# Patient Record
Sex: Female | Born: 1950 | Race: White | Hispanic: No | Marital: Married | Smoking: Former smoker
Health system: Southern US, Community
[De-identification: ages and names within clinical notes are randomized; demographics above are authoritative.]

## PROBLEM LIST (undated history)

## (undated) DIAGNOSIS — M1990 Unspecified osteoarthritis, unspecified site: Secondary | ICD-10-CM

## (undated) DIAGNOSIS — T8859XA Other complications of anesthesia, initial encounter: Secondary | ICD-10-CM

## (undated) DIAGNOSIS — R112 Nausea with vomiting, unspecified: Secondary | ICD-10-CM

## (undated) DIAGNOSIS — K219 Gastro-esophageal reflux disease without esophagitis: Secondary | ICD-10-CM

## (undated) DIAGNOSIS — I10 Essential (primary) hypertension: Secondary | ICD-10-CM

## (undated) DIAGNOSIS — Z9889 Other specified postprocedural states: Secondary | ICD-10-CM

## (undated) DIAGNOSIS — E119 Type 2 diabetes mellitus without complications: Secondary | ICD-10-CM

## (undated) HISTORY — PX: COLONOSCOPY: SHX174

## (undated) HISTORY — PX: HYSTERECTOMY ABDOMINAL WITH SALPINGECTOMY: SHX6725

## (undated) HISTORY — PX: BACK SURGERY: SHX140

## (undated) HISTORY — PX: BLADDER SURGERY: SHX569

---

## 2020-05-16 NOTE — Patient Instructions (Addendum)
DUE TO COVID-19 ONLY ONE VISITOR IS ALLOWED TO COME WITH YOU AND STAY IN THE WAITING ROOM ONLY DURING PRE OP AND PROCEDURE DAY OF SURGERY. THE 1 VISITOR MAY VISIT WITH YOU AFTER SURGERY IN YOUR PRIVATE ROOM DURING VISITING HOURS ONLY!  YOU NEED TO HAVE A COVID 19 TEST ON 05-21-20 @ 10:55 AM, THIS TEST MUST BE DONE BEFORE SURGERY, COME  [address], [place] , [address].  [facility]) ONCE YOUR COVID TEST IS COMPLETED, PLEASE BEGIN THE QUARANTINE INSTRUCTIONS AS OUTLINED IN YOUR HANDOUT.                Robyn Johnston  05/16/2020   Your procedure is scheduled on: 05-25-20   Report to [facility] Main  Entrance    Report to Admitting at 12:40 PM     Call this number if you have problems the morning of surgery [phone]    Remember: FTER MIDNIGHT THE NIGHT PRIOR TO SURGERY. NOTHING BY MOUTH EXCEPT CLEAR LIQUIDS UNTIL 12:10 AM . PLEASE FINISH G2 DRINK PER SURGEON ORDER  WHICH NEEDS TO BE COMPLETED AT 12:10 AM .   CLEAR LIQUID DIET   Foods Allowed                                                                     Foods Excluded  Coffee and tea, regular and decaf                             liquids that you cannot  Plain Jell-O any favor except red or purple                                           see through such as: Fruit ices (not with fruit pulp)                                     milk, soups, orange juice  Iced Popsicles                                    All solid food Carbonated beverages, diet                                    Cranberry, grape and apple juices Sports drinks like Gatorade Lightly seasoned clear broth or consume(fat free) Sugar, honey syrup   _____________________________________________________________________       Take these medicines the morning of surgery with A SIP OF WATER: Atorvastatin, Esomeprazole (Nexium), Gabapentin (Neurontin)   BRUSH YOUR TEETH MORNING OF SURGERY AND RINSE YOUR MOUTH OUT, NO CHEWING GUM CANDY OR  MINTS.   DO NOT TAKE ANY DIABETIC MEDICATIONS DAY OF YOUR SURGERY  You may not have any metal on your body including hair pins and              piercings    Do not wear jewelry, make-up, lotions, powders or perfumes, deodorant              Do not wear nail polish on your fingernails.  Do not shave  48 hours prior to surgery.              Do not bring valuables to the hospital. [facility] IS NOT             RESPONSIBLE   FOR VALUABLES.  Contacts, dentures or bridgework may not be worn into surgery.  You may bring a small overnight bag     Special Instructions: N/A              Please read over the following fact sheets you were given: _____________________________________________________________________  How to Manage Your Diabetes Before and After Surgery  Why is it important to control my blood sugar before and after surgery? . Improving blood sugar levels before and after surgery helps healing and can limit problems. . A way of improving blood sugar control is eating a healthy diet by: o  Eating less sugar and carbohydrates o  Increasing activity/exercise o  Talking with your doctor about reaching your blood sugar goals . High blood sugars (greater than 180 mg/dL) can raise your risk of infections and slow your recovery, so you will need to focus on controlling your diabetes during the weeks before surgery. . Make sure that the doctor who takes care of your diabetes knows about your planned surgery including the date and location.  How do I manage my blood sugar before surgery? . Check your blood sugar at least 4 times a day, starting 2 days before surgery, to make sure that the level is not too high or low. o Check your blood sugar the morning of your surgery when you wake up and every 2 hours until you get to the Short Stay unit. . If your blood sugar is less than 70 mg/dL, you will need to treat for low blood sugar: o Do not take  insulin. o Treat a low blood sugar (less than 70 mg/dL) with  cup of clear juice (cranberry or apple), 4 glucose tablets, OR glucose gel. o Recheck blood sugar in 15 minutes after treatment (to make sure it is greater than 70 mg/dL). If your blood sugar is not greater than 70 mg/dL on recheck, call [phone] for further instructions. . Report your blood sugar to the short stay nurse when you get to Short Stay.  . If you are admitted to the hospital after surgery: o Your blood sugar will be checked by the staff and you will probably be given insulin after surgery (instead of oral diabetes medicines) to make sure you have good blood sugar levels. o The goal for blood sugar control after surgery is 80-180 mg/dL.   WHAT DO I DO ABOUT MY DIABETES MEDICATION?  Marland Kitchen Do not take oral diabetes medicines (pills) the morning of surgery.  . THE DAY BEFORE SURGERY, take your usual Metformin        Reviewed and Endorsed by [facility] Patient Education Committee, August 2015           [facility] - Preparing for Surgery Before surgery, you can play an important role.  Because skin is not sterile, your skin needs to be  as free of germs as possible.  You can reduce the number of germs on your skin by washing with CHG (chlorahexidine gluconate) soap before surgery.  CHG is an antiseptic cleaner which kills germs and bonds with the skin to continue killing germs even after washing. Please DO NOT use if you have an allergy to CHG or antibacterial soaps.  If your skin becomes reddened/irritated stop using the CHG and inform your nurse when you arrive at Short Stay. Do not shave (including legs and underarms) for at least 48 hours prior to the first CHG shower.  You may shave your face/neck. Please follow these instructions carefully:  1.  Shower with CHG Soap the night before surgery and the  morning of Surgery.  2.  If you choose to wash your hair, wash your hair first as usual with your  normal   shampoo.  3.  After you shampoo, rinse your hair and body thoroughly to remove the  shampoo.                           4.  Use CHG as you would any other liquid soap.  You can apply chg directly  to the skin and wash                       Gently with a scrungie or clean washcloth.  5.  Apply the CHG Soap to your body ONLY FROM THE NECK DOWN.   Do not use on face/ open                           Wound or open sores. Avoid contact with eyes, ears mouth and genitals (private parts).                       Wash face,  Genitals (private parts) with your normal soap.             6.  Wash thoroughly, paying special attention to the area where your surgery  will be performed.  7.  Thoroughly rinse your body with warm water from the neck down.  8.  DO NOT shower/wash with your normal soap after using and rinsing off  the CHG Soap.                9.  Pat yourself dry with a clean towel.            10.  Wear clean pajamas.            11.  Place clean sheets on your bed the night of your first shower and do not  sleep with pets. Day of Surgery : Do not apply any lotions/deodorants the morning of surgery.  Please wear clean clothes to the hospital/surgery center.  FAILURE TO FOLLOW THESE INSTRUCTIONS MAY RESULT IN THE CANCELLATION OF YOUR SURGERY PATIENT SIGNATURE_________________________________  NURSE SIGNATURE__________________________________  ________________________________________________________________________   Adam Phenix  An incentive spirometer is a tool that can help keep your lungs clear and active. This tool measures how well you are filling your lungs with each breath. Taking long deep breaths may help reverse or decrease the chance of developing breathing (pulmonary) problems (especially infection) following:  A long period of time when you are unable to move or be active. BEFORE THE PROCEDURE   If the spirometer includes an indicator to show your best effort,  your nurse or  respiratory therapist will set it to a desired goal.  If possible, sit up straight or lean slightly forward. Try not to slouch.  Hold the incentive spirometer in an upright position. INSTRUCTIONS FOR USE  1. Sit on the edge of your bed if possible, or sit up as far as you can in bed or on a chair. 2. Hold the incentive spirometer in an upright position. 3. Breathe out normally. 4. Place the mouthpiece in your mouth and seal your lips tightly around it. 5. Breathe in slowly and as deeply as possible, raising the piston or the ball toward the top of the column. 6. Hold your breath for 3-5 seconds or for as long as possible. Allow the piston or ball to fall to the bottom of the column. 7. Remove the mouthpiece from your mouth and breathe out normally. 8. Rest for a few seconds and repeat Steps 1 through 7 at least 10 times every 1-2 hours when you are awake. Take your time and take a few normal breaths between deep breaths. 9. The spirometer may include an indicator to show your best effort. Use the indicator as a goal to work toward during each repetition. 10. After each set of 10 deep breaths, practice coughing to be sure your lungs are clear. If you have an incision (the cut made at the time of surgery), support your incision when coughing by placing a pillow or rolled up towels firmly against it. Once you are able to get out of bed, walk around indoors and cough well. You may stop using the incentive spirometer when instructed by your caregiver.  RISKS AND COMPLICATIONS  Take your time so you do not get dizzy or light-headed.  If you are in pain, you may need to take or ask for pain medication before doing incentive spirometry. It is harder to take a deep breath if you are having pain. AFTER USE  Rest and breathe slowly and easily.  It can be helpful to keep track of a log of your progress. Your caregiver can provide you with a simple table to help with this. If you are using the  spirometer at home, follow these instructions: SEEK MEDICAL CARE IF:   You are having difficultly using the spirometer.  You have trouble using the spirometer as often as instructed.  Your pain medication is not giving enough relief while using the spirometer.  You develop fever of 100.5 F (38.1 C) or higher. SEEK IMMEDIATE MEDICAL CARE IF:   You cough up bloody sputum that had not been present before.  You develop fever of 102 F (38.9 C) or greater.  You develop worsening pain at or near the incision site. MAKE SURE YOU:   Understand these instructions.  Will watch your condition.  Will get help right away if you are not doing well or get worse. Document Released: 04/08/2007 Document Revised: 02/18/2012 Document Reviewed: 06/09/2007 ExitCare Patient Information 2014 ExitCare, [place].   ________________________________________________________________________  WHAT IS A BLOOD TRANSFUSION? Blood Transfusion Information  A transfusion is the replacement of blood or some of its parts. Blood is made up of multiple cells which provide different functions.  Red blood cells carry oxygen and are used for blood loss replacement.  White blood cells fight against infection.  Platelets control bleeding.  Plasma helps clot blood.  Other blood products are available for specialized needs, such as hemophilia or other clotting disorders. BEFORE THE TRANSFUSION  Who gives blood for transfusions?  Healthy volunteers who are fully evaluated to make sure their blood is safe. This is blood bank blood. Transfusion therapy is the safest it has ever been in the practice of medicine. Before blood is taken from a donor, a complete history is taken to make sure that person has no history of diseases nor engages in risky social behavior (examples are intravenous drug use or sexual activity with multiple partners). The donor's travel history is screened to minimize risk of transmitting  infections, such as malaria. The donated blood is tested for signs of infectious diseases, such as HIV and hepatitis. The blood is then tested to be sure it is compatible with you in order to minimize the chance of a transfusion reaction. If you or a relative donates blood, this is often done in anticipation of surgery and is not appropriate for emergency situations. It takes many days to process the donated blood. RISKS AND COMPLICATIONS Although transfusion therapy is very safe and saves many lives, the main dangers of transfusion include:   Getting an infectious disease.  Developing a transfusion reaction. This is an allergic reaction to something in the blood you were given. Every precaution is taken to prevent this. The decision to have a blood transfusion has been considered carefully by your caregiver before blood is given. Blood is not given unless the benefits outweigh the risks. AFTER THE TRANSFUSION  Right after receiving a blood transfusion, you will usually feel much better and more energetic. This is especially true if your red blood cells have gotten low (anemic). The transfusion raises the level of the red blood cells which carry oxygen, and this usually causes an energy increase.  The nurse administering the transfusion will monitor you carefully for complications. HOME CARE INSTRUCTIONS  No special instructions are needed after a transfusion. You may find your energy is better. Speak with your caregiver about any limitations on activity for underlying diseases you may have. SEEK MEDICAL CARE IF:   Your condition is not improving after your transfusion.  You develop redness or irritation at the intravenous (IV) site. SEEK IMMEDIATE MEDICAL CARE IF:  Any of the following symptoms occur over the next 12 hours:  Shaking chills.  You have a temperature by mouth above 102 F (38.9 C), not controlled by medicine.  Chest, back, or muscle pain.  People around you feel you are  not acting correctly or are confused.  Shortness of breath or difficulty breathing.  Dizziness and fainting.  You get a rash or develop hives.  You have a decrease in urine output.  Your urine turns a dark color or changes to pink, red, or brown. Any of the following symptoms occur over the next 10 days:  You have a temperature by mouth above 102 F (38.9 C), not controlled by medicine.  Shortness of breath.  Weakness after normal activity.  The white part of the eye turns yellow (jaundice).  You have a decrease in the amount of urine or are urinating less often.  Your urine turns a dark color or changes to pink, red, or brown. Document Released: 11/23/2000 Document Revised: 02/18/2012 Document Reviewed: 07/12/2008 [facility] Patient Information 2014 [place], [place].  _______________________________________________________________________

## 2020-05-16 NOTE — Progress Notes (Addendum)
PCP - Dr. Kara Pacer,  [facility]  Cardiologist - N/A No back stimulator  PPM/ICD -  Device Orders -  Rep Notified -   Chest x-ray -  EKG -  Stress Test -  ECHO -  Cardiac Cath -   Sleep Study -  CPAP -   Fasting Blood Sugar -  Checks Blood Sugar _____ times a day  Blood Thinner Instructions: Aspirin Instructions:  ERAS Protcol - PRE-SURGERY Ensure or G2-   COVID TEST-  COVID Vaccinations x 2  Anesthesia review:   Patient denies shortness of breath, fever, cough and chest pain at PAT appointment   All instructions explained to the patient, with a verbal understanding of the material. Patient agrees to go over the instructions while at home for a better understanding. Patient also instructed to self quarantine after being tested for COVID-19. The opportunity to ask questions was provided.

## 2020-05-17 ENCOUNTER — Other Ambulatory Visit: Payer: Self-pay

## 2020-05-17 ENCOUNTER — Encounter (HOSPITAL_COMMUNITY)
Admission: RE | Admit: 2020-05-17 | Discharge: 2020-05-17 | Disposition: A | Discharge status: Home / Self Care | Payer: Medicare HMO | Source: Ambulatory Visit | Attending: Orthopedic Surgery | Admitting: Orthopedic Surgery

## 2020-05-17 ENCOUNTER — Encounter (HOSPITAL_COMMUNITY): Payer: Self-pay

## 2020-05-17 HISTORY — DX: Nausea with vomiting, unspecified: R11.2

## 2020-05-17 HISTORY — DX: Type 2 diabetes mellitus without complications: E11.9

## 2020-05-17 HISTORY — DX: Gastro-esophageal reflux disease without esophagitis: K21.9

## 2020-05-17 HISTORY — DX: Essential (primary) hypertension: I10

## 2020-05-17 HISTORY — DX: Other specified postprocedural states: Z98.890

## 2020-05-17 HISTORY — DX: Other complications of anesthesia, initial encounter: T88.59XA

## 2020-05-17 HISTORY — DX: Unspecified osteoarthritis, unspecified site: M19.90

## 2020-05-20 ENCOUNTER — Encounter (HOSPITAL_COMMUNITY)
Admission: RE | Admit: 2020-05-20 | Discharge: 2020-05-20 | Disposition: A | Discharge status: Home / Self Care | Payer: Medicare HMO | Source: Ambulatory Visit | Attending: Orthopedic Surgery | Admitting: Orthopedic Surgery

## 2020-05-20 ENCOUNTER — Other Ambulatory Visit: Payer: Self-pay

## 2020-05-20 DIAGNOSIS — Z01818 Encounter for other preprocedural examination: Secondary | ICD-10-CM | POA: Diagnosis present

## 2020-05-20 LAB — COMPREHENSIVE METABOLIC PANEL
ALT: 12 U/L (ref 0–44)
AST: 16 U/L (ref 15–41)
Albumin: 4 g/dL (ref 3.5–5.0)
Alkaline Phosphatase: 53 U/L (ref 38–126)
Anion gap: 8 (ref 5–15)
BUN: 13 mg/dL (ref 8–23)
CO2: 31 mmol/L (ref 22–32)
Calcium: 9.6 mg/dL (ref 8.9–10.3)
Chloride: 102 mmol/L (ref 98–111)
Creatinine, Ser: 0.86 mg/dL (ref 0.44–1.00)
GFR calc Af Amer: >60 mL/min (ref >60)
GFR calc non Af Amer: >60 mL/min (ref >60)
Glucose, Bld: 116 mg/dL — ABNORMAL HIGH (ref 70–99)
Potassium: 4.4 mmol/L (ref 3.5–5.1)
Sodium: 141 mmol/L (ref 135–145)
Total Bilirubin: 0.8 mg/dL (ref 0.3–1.2)
Total Protein: 6.8 g/dL (ref 6.5–8.1)

## 2020-05-20 LAB — CBC
HCT: 35.6 % — ABNORMAL LOW (ref 36.0–46.0)
Hemoglobin: 11.8 g/dL — ABNORMAL LOW (ref 12.0–15.0)
MCH: 33 pg (ref 26.0–34.0)
MCHC: 33.1 g/dL (ref 30.0–36.0)
MCV: 99.4 fL (ref 80.0–100.0)
Platelets: 215 10*3/uL (ref 150–400)
RBC: 3.58 MIL/uL — ABNORMAL LOW (ref 3.87–5.11)
RDW: 12.4 % (ref 11.5–15.5)
WBC: 5.7 10*3/uL (ref 4.0–10.5)
nRBC: 0 % (ref 0.0–0.2)

## 2020-05-20 LAB — HEMOGLOBIN A1C
Hgb A1c MFr Bld: 6.1 % — ABNORMAL HIGH (ref 4.8–5.6)
Mean Plasma Glucose: 128.37 mg/dL

## 2020-05-20 LAB — PROTIME-INR
INR: 1 (ref 0.8–1.2)
Prothrombin Time: 12.7 seconds (ref 11.4–15.2)

## 2020-05-20 LAB — ABO/RH: ABO/RH(D): O POS

## 2020-05-20 LAB — GLUCOSE, CAPILLARY: Glucose-Capillary: 96 mg/dL (ref 70–99)

## 2020-05-20 LAB — APTT: aPTT: 35 seconds (ref 24–36)

## 2020-05-21 ENCOUNTER — Other Ambulatory Visit (HOSPITAL_COMMUNITY)
Admission: RE | Admit: 2020-05-21 | Discharge: 2020-05-21 | Disposition: A | Discharge status: Home / Self Care | Payer: Medicare HMO | Source: Ambulatory Visit | Attending: Orthopedic Surgery | Admitting: Orthopedic Surgery

## 2020-05-21 DIAGNOSIS — Z20822 Contact with and (suspected) exposure to covid-19: Secondary | ICD-10-CM | POA: Diagnosis not present

## 2020-05-21 DIAGNOSIS — Z01812 Encounter for preprocedural laboratory examination: Secondary | ICD-10-CM | POA: Diagnosis present

## 2020-05-21 LAB — SARS CORONAVIRUS 2 (TAT 6-24 HRS): SARS Coronavirus 2: NEGATIVE

## 2020-05-23 NOTE — H&P (Signed)
TOTAL HIP ADMISSION H&P  Patient is admitted for left total hip arthroplasty.  Subjective:  Chief Complaint: Left hip pain  HPI: Robyn Johnston, 69 y.o. female, has a history of pain and functional disability in the left hip due to arthritis and patient has failed non-surgical conservative treatments for greater than 12 weeks to include corticosteriod injections and activity modification. Onset of symptoms was gradual, starting 3 years ago with gradually worsening course since that time. The patient noted no past surgery on the left hip. Patient currently rates pain in the left hip at 7 out of 10 with activity. Patient has worsening of pain with activity and weight bearing, pain that interfers with activities of daily living and instability. Patient has evidence of severe end-stage arthritis of both hips, bone-on-bone throughout, with subchondral cystic formation in both by imaging studies. This condition presents safety issues increasing the risk of falls. There is no current active infection.  There are no problems to display for this patient.   Past Medical History:  Diagnosis Date  . Arthritis   . Complication of anesthesia   . Diabetes mellitus without complication (HCC)   . GERD (gastroesophageal reflux disease)   . Hypertension   . PONV (postoperative nausea and vomiting)     Past Surgical History:  Procedure Laterality Date  . BACK SURGERY     spinal stenosis  . BLADDER SURGERY    . COLONOSCOPY    . HYSTERECTOMY ABDOMINAL WITH SALPINGECTOMY      Prior to Admission medications   Medication Sig Start Date End Date Taking? Authorizing Provider  atorvastatin (LIPITOR) 10 MG tablet Take 10 mg by mouth daily. 05/02/20  Yes [provider]  Biotin 2500 MCG CAPS Take 2,500 mcg by mouth daily.   Yes [provider]  Cholecalciferol (VITAMIN D) 125 MCG (5000 UT) CAPS Take 5,000 Units by mouth daily.   Yes [provider]  Cyanocobalamin (B-12) 2500 MCG TABS  Take 2,500 mcg by mouth daily.   Yes [provider]  esomeprazole (NEXIUM) 40 MG capsule Take 40 mg by mouth daily. 04/18/20  Yes [provider]  gabapentin (NEURONTIN) 600 MG tablet Take 600 mg by mouth in the morning, at noon, in the evening, and at bedtime. 04/08/20  Yes [provider]  lisinopril-hydrochlorothiazide (ZESTORETIC) 20-25 MG tablet Take 1 tablet by mouth daily. 05/10/20  Yes [provider]  metFORMIN (GLUCOPHAGE-XR) 500 MG 24 hr tablet Take 500 mg by mouth daily. 04/18/20  Yes [provider]  Multiple Vitamin (MULTIVITAMIN WITH MINERALS) TABS tablet Take 1 tablet by mouth daily.   Yes [provider]    Allergies  Allergen Reactions  . Penicillins Hives    Social History   Socioeconomic History  . Marital status: Married    Spouse name: Not on file  . Number of children: Not on file  . Years of education: Not on file  . Highest education level: Not on file  Occupational History  . Not on file  Tobacco Use  . Smoking status: Former Smoker    Quit date: 2011    Years since quitting: 10.4  . Smokeless tobacco: Never Used  Vaping Use  . Vaping Use: Never used  Substance and Sexual Activity  . Alcohol use: Never  . Drug use: Never  . Sexual activity: Not on file  Other Topics Concern  . Not on file  Social History Narrative  . Not on file   Social Determinants of Health  Financial Resource Strain:   . Difficulty of Paying Living Expenses:   Food Insecurity:   . Worried About Charity fundraiser in the Last Year:   . Arboriculturist in the Last Year:   Transportation Needs:   . Film/video editor (Medical):   Marland Kitchen Lack of Transportation (Non-Medical):   Physical Activity:   . Days of Exercise per Week:   . Minutes of Exercise per Session:   Stress:   . Feeling of Stress :   Social Connections:   . Frequency of Communication with Friends and Family:   . Frequency of Social Gatherings with Friends  and Family:   . Attends Religious Services:   . Active Member of Clubs or Organizations:   . Attends Archivist Meetings:   Marland Kitchen Marital Status:   Intimate Partner Violence:   . Fear of Current or Ex-Partner:   . Emotionally Abused:   Marland Kitchen Physically Abused:   . Sexually Abused:       Tobacco Use: Medium Risk  . Smoking Tobacco Use: Former Smoker  . Smokeless Tobacco Use: Never Used   Social History   Substance and Sexual Activity  Alcohol Use Never    No family history on file.  Review of Systems  Constitutional: Negative for chills and fever.  HENT: Negative for congestion, sore throat and tinnitus.   Eyes: Negative for double vision, photophobia and pain.  Respiratory: Negative for cough, shortness of breath and wheezing.   Cardiovascular: Negative for chest pain, palpitations and orthopnea.  Gastrointestinal: Negative for heartburn, nausea and vomiting.  Genitourinary: Negative for dysuria, frequency and urgency.  Musculoskeletal: Positive for joint pain.  Neurological: Negative for dizziness, weakness and headaches.     Objective:  Physical Exam:  Well nourished and well developed.  General: Alert and oriented x3, cooperative and pleasant, no acute distress.  Head: normocephalic, atraumatic, neck supple.  Eyes: EOMI.  Respiratory: breath sounds clear in all fields, no wheezing, rales, or rhonchi. Cardiovascular: Regular rate and rhythm, no murmurs, gallops or rubs.  Abdomen: non-tender to palpation and soft, normoactive bowel sounds. Musculoskeletal:  Left Hip Exam: The range of motion: Flexion to 90 degrees, Internal Rotation to 0 degrees, External Rotation to 0 degrees, and abduction to 0 degrees.  Calves soft and nontender. Motor function intact in LE. Strength 5/5 LE bilaterally. Neuro: Distal pulses 2+. Sensation to light touch intact in LE.  Imaging Review Plain radiographs demonstrate severe degenerative joint disease of the left hip. The bone  quality appears to be adequate for age and reported activity level.  Assessment/Plan:  End stage arthritis, left hip  The patient history, physical examination, clinical judgement of the provider and imaging studies are consistent with end stage degenerative joint disease of the left hip and total hip arthroplasty is deemed medically necessary. The treatment options including medical management, injection therapy, arthroscopy and arthroplasty were discussed at length. The risks and benefits of total hip arthroplasty were presented and reviewed. The risks due to aseptic loosening, infection, stiffness, dislocation/subluxation, thromboembolic complications and other imponderables were discussed. The patient acknowledged the explanation, agreed to proceed with the plan and consent was signed. Patient is being admitted for inpatient treatment for surgery, pain control, PT, OT, prophylactic antibiotics, VTE prophylaxis, progressive ambulation and ADLs and discharge planning.The patient is planning to be discharged home.   Patient's anticipated LOS is less than 2 midnights, meeting these requirements: - Younger than 34 - Lives within 1 hour of care -  Has a competent adult at home to recover with post-op recover - NO history of  - Chronic pain requiring opiods  - Diabetes  - Coronary Artery Disease  - Heart failure  - Heart attack  - Stroke  - DVT/VTE  - Cardiac arrhythmia  - Respiratory Failure/COPD  - Renal failure  - Anemia  - Advanced Liver disease  Therapy Plans: HEP Disposition: Home with husband Planned DVT Prophylaxis: Xarelto 10 mg QD DME Needed: None PCP: Sharlotte Alamo, MD  TXA: IV Allergies: PCN (hives) Anesthesia Concerns: Nausea/vomiting BMI: 32.8 Last HgbA1c: 6.1%  Other: - Cannot tolerate aspirin  - Patient was instructed on what medications to stop prior to surgery. - Follow-up visit in 2 weeks with [name] - Begin physical therapy following  surgery - Pre-operative lab work as pre-surgical testing - Prescriptions will be provided in hospital at time of discharge  Arther Abbott, PA-C Orthopedic Surgery [facility] [place]

## 2020-05-24 ENCOUNTER — Encounter (HOSPITAL_COMMUNITY): Payer: Self-pay | Admitting: Orthopedic Surgery

## 2020-05-24 LAB — SURGICAL PCR SCREEN
MRSA, PCR: NEGATIVE
Staphylococcus aureus: NEGATIVE

## 2020-05-25 ENCOUNTER — Ambulatory Visit (HOSPITAL_COMMUNITY)
Admission: RE | Admit: 2020-05-25 | Discharge: 2020-05-26 | Disposition: A | Discharge status: Home / Self Care | Payer: Medicare HMO | Attending: Orthopedic Surgery | Admitting: Orthopedic Surgery

## 2020-05-25 ENCOUNTER — Encounter (HOSPITAL_COMMUNITY): Payer: Self-pay | Admitting: Orthopedic Surgery

## 2020-05-25 ENCOUNTER — Ambulatory Visit (HOSPITAL_COMMUNITY): Payer: Medicare HMO

## 2020-05-25 ENCOUNTER — Ambulatory Visit (HOSPITAL_COMMUNITY): Payer: Medicare HMO | Admitting: Physician Assistant

## 2020-05-25 ENCOUNTER — Encounter (HOSPITAL_COMMUNITY)
Admission: RE | Disposition: A | Discharge status: Home / Self Care | Payer: Self-pay | Source: Home / Self Care | Attending: Orthopedic Surgery

## 2020-05-25 ENCOUNTER — Other Ambulatory Visit: Payer: Self-pay

## 2020-05-25 ENCOUNTER — Ambulatory Visit (HOSPITAL_COMMUNITY): Payer: Medicare HMO | Admitting: Anesthesiology

## 2020-05-25 DIAGNOSIS — K219 Gastro-esophageal reflux disease without esophagitis: Secondary | ICD-10-CM | POA: Insufficient documentation

## 2020-05-25 DIAGNOSIS — M25852 Other specified joint disorders, left hip: Secondary | ICD-10-CM | POA: Diagnosis not present

## 2020-05-25 DIAGNOSIS — M1612 Unilateral primary osteoarthritis, left hip: Secondary | ICD-10-CM | POA: Diagnosis present

## 2020-05-25 DIAGNOSIS — Z880 Allergy status to penicillin: Secondary | ICD-10-CM | POA: Insufficient documentation

## 2020-05-25 DIAGNOSIS — M25752 Osteophyte, left hip: Secondary | ICD-10-CM | POA: Insufficient documentation

## 2020-05-25 DIAGNOSIS — Z79899 Other long term (current) drug therapy: Secondary | ICD-10-CM | POA: Diagnosis not present

## 2020-05-25 DIAGNOSIS — I10 Essential (primary) hypertension: Secondary | ICD-10-CM | POA: Insufficient documentation

## 2020-05-25 DIAGNOSIS — Z87891 Personal history of nicotine dependence: Secondary | ICD-10-CM | POA: Insufficient documentation

## 2020-05-25 DIAGNOSIS — Z7984 Long term (current) use of oral hypoglycemic drugs: Secondary | ICD-10-CM | POA: Diagnosis not present

## 2020-05-25 DIAGNOSIS — E119 Type 2 diabetes mellitus without complications: Secondary | ICD-10-CM | POA: Diagnosis not present

## 2020-05-25 DIAGNOSIS — Z96649 Presence of unspecified artificial hip joint: Secondary | ICD-10-CM

## 2020-05-25 DIAGNOSIS — M169 Osteoarthritis of hip, unspecified: Secondary | ICD-10-CM | POA: Diagnosis present

## 2020-05-25 DIAGNOSIS — Z419 Encounter for procedure for purposes other than remedying health state, unspecified: Secondary | ICD-10-CM

## 2020-05-25 HISTORY — PX: TOTAL HIP ARTHROPLASTY: SHX124

## 2020-05-25 LAB — TYPE AND SCREEN
ABO/RH(D): O POS
Antibody Screen: NEGATIVE

## 2020-05-25 LAB — GLUCOSE, CAPILLARY
Glucose-Capillary: 86 mg/dL (ref 70–99)
Glucose-Capillary: 91 mg/dL (ref 70–99)
Glucose-Capillary: 154 mg/dL — ABNORMAL HIGH (ref 70–99)

## 2020-05-25 SURGERY — ARTHROPLASTY, HIP, TOTAL, ANTERIOR APPROACH
Anesthesia: Spinal | Site: Hip | Laterality: Left

## 2020-05-25 MED ORDER — HYDROCHLOROTHIAZIDE 25 MG PO TABS
25.0000 mg | ORAL_TABLET | Freq: Every day | ORAL | Status: DC
  Administered 2020-05-26: 25 mg via ORAL
  Filled 2020-05-25: qty 1

## 2020-05-25 MED ORDER — CEFAZOLIN SODIUM-DEXTROSE 2-4 GM/100ML-% IV SOLN
2.0000 g | Freq: Four times a day (QID) | INTRAVENOUS | Status: AC
  Administered 2020-05-25 – 2020-05-26 (×2): 2 g via INTRAVENOUS
  Filled 2020-05-25 (×2): qty 100

## 2020-05-25 MED ORDER — GABAPENTIN 300 MG PO CAPS
600.0000 mg | ORAL_CAPSULE | Freq: Three times a day (TID) | ORAL | Status: DC
  Administered 2020-05-25 – 2020-05-26 (×3): 600 mg via ORAL
  Filled 2020-05-25 (×3): qty 2

## 2020-05-25 MED ORDER — FENTANYL CITRATE (PF) 100 MCG/2ML IJ SOLN
25.0000 ug | INTRAMUSCULAR | Status: DC | PRN
  Administered 2020-05-25: 50 ug via INTRAVENOUS

## 2020-05-25 MED ORDER — PHENOL 1.4 % MT LIQD: 1.0000 | OROMUCOSAL | Status: DC | PRN

## 2020-05-25 MED ORDER — METOCLOPRAMIDE HCL 5 MG PO TABS: 5.0000 mg | ORAL_TABLET | Freq: Three times a day (TID) | ORAL | Status: DC | PRN

## 2020-05-25 MED ORDER — PHENYLEPHRINE HCL-NACL 10-0.9 MG/250ML-% IV SOLN
INTRAVENOUS | Status: DC | PRN
  Administered 2020-05-25: 25 ug/min via INTRAVENOUS

## 2020-05-25 MED ORDER — ONDANSETRON HCL 4 MG/2ML IJ SOLN: 4.0000 mg | Freq: Four times a day (QID) | INTRAMUSCULAR | Status: DC | PRN

## 2020-05-25 MED ORDER — FENTANYL CITRATE (PF) 100 MCG/2ML IJ SOLN
INTRAMUSCULAR | Status: DC | PRN
  Administered 2020-05-25: 25 ug via INTRAVENOUS
  Administered 2020-05-25: 50 ug via INTRAVENOUS
  Administered 2020-05-25: 25 ug via INTRAVENOUS

## 2020-05-25 MED ORDER — FENTANYL CITRATE (PF) 100 MCG/2ML IJ SOLN
INTRAMUSCULAR | Status: AC
  Filled 2020-05-25: qty 2

## 2020-05-25 MED ORDER — ORAL CARE MOUTH RINSE: 15.0000 mL | Freq: Once | OROMUCOSAL | Status: AC

## 2020-05-25 MED ORDER — METHOCARBAMOL 500 MG IVPB - SIMPLE MED
500.0000 mg | Freq: Four times a day (QID) | INTRAVENOUS | Status: DC | PRN
  Filled 2020-05-25: qty 50

## 2020-05-25 MED ORDER — METOCLOPRAMIDE HCL 5 MG/ML IJ SOLN: 5.0000 mg | Freq: Three times a day (TID) | INTRAMUSCULAR | Status: DC | PRN

## 2020-05-25 MED ORDER — 0.9 % SODIUM CHLORIDE (POUR BTL) OPTIME
TOPICAL | Status: DC | PRN
  Administered 2020-05-25: 1000 mL

## 2020-05-25 MED ORDER — ONDANSETRON HCL 4 MG PO TABS: 4.0000 mg | ORAL_TABLET | Freq: Four times a day (QID) | ORAL | Status: DC | PRN

## 2020-05-25 MED ORDER — SODIUM CHLORIDE 0.9 % IV SOLN
INTRAVENOUS | Status: DC
  Administered 2020-05-25: 1000 mL via INTRAVENOUS

## 2020-05-25 MED ORDER — BISACODYL 10 MG RE SUPP: 10.0000 mg | Freq: Every day | RECTAL | Status: DC | PRN

## 2020-05-25 MED ORDER — TRANEXAMIC ACID-NACL 1000-0.7 MG/100ML-% IV SOLN
1000.0000 mg | INTRAVENOUS | Status: AC
  Administered 2020-05-25: 1000 mg via INTRAVENOUS
  Filled 2020-05-25: qty 100

## 2020-05-25 MED ORDER — PANTOPRAZOLE SODIUM 40 MG PO TBEC
80.0000 mg | DELAYED_RELEASE_TABLET | Freq: Every day | ORAL | Status: DC
  Administered 2020-05-26: 80 mg via ORAL
  Filled 2020-05-25: qty 2

## 2020-05-25 MED ORDER — MIDAZOLAM HCL 2 MG/2ML IJ SOLN
INTRAMUSCULAR | Status: AC
  Filled 2020-05-25: qty 2

## 2020-05-25 MED ORDER — POVIDONE-IODINE 10 % EX SWAB
2.0000 "application " | Freq: Once | CUTANEOUS | Status: AC
  Administered 2020-05-25: 2 via TOPICAL

## 2020-05-25 MED ORDER — CEFAZOLIN SODIUM-DEXTROSE 2-4 GM/100ML-% IV SOLN
2.0000 g | INTRAVENOUS | Status: AC
  Administered 2020-05-25: 2 g via INTRAVENOUS
  Filled 2020-05-25: qty 100

## 2020-05-25 MED ORDER — MORPHINE SULFATE (PF) 2 MG/ML IV SOLN
0.5000 mg | INTRAVENOUS | Status: DC | PRN
  Administered 2020-05-25 – 2020-05-26 (×5): 1 mg via INTRAVENOUS
  Filled 2020-05-25 (×5): qty 1

## 2020-05-25 MED ORDER — ONDANSETRON HCL 4 MG/2ML IJ SOLN: 4.0000 mg | Freq: Once | INTRAMUSCULAR | Status: DC | PRN

## 2020-05-25 MED ORDER — ATORVASTATIN CALCIUM 10 MG PO TABS
10.0000 mg | ORAL_TABLET | Freq: Every day | ORAL | Status: DC
  Administered 2020-05-26: 10 mg via ORAL
  Filled 2020-05-25: qty 1

## 2020-05-25 MED ORDER — TRAMADOL HCL 50 MG PO TABS
50.0000 mg | ORAL_TABLET | Freq: Four times a day (QID) | ORAL | Status: DC | PRN
  Administered 2020-05-26: 100 mg via ORAL
  Filled 2020-05-25: qty 2

## 2020-05-25 MED ORDER — ACETAMINOPHEN 325 MG PO TABS: 325.0000 mg | ORAL_TABLET | Freq: Four times a day (QID) | ORAL | Status: DC | PRN

## 2020-05-25 MED ORDER — DOCUSATE SODIUM 100 MG PO CAPS
100.0000 mg | ORAL_CAPSULE | Freq: Two times a day (BID) | ORAL | Status: DC
  Administered 2020-05-25 – 2020-05-26 (×2): 100 mg via ORAL
  Filled 2020-05-25 (×2): qty 1

## 2020-05-25 MED ORDER — CHLORHEXIDINE GLUCONATE 0.12 % MT SOLN
15.0000 mL | Freq: Once | OROMUCOSAL | Status: AC
  Administered 2020-05-25: 15 mL via OROMUCOSAL

## 2020-05-25 MED ORDER — HYDROCODONE-ACETAMINOPHEN 5-325 MG PO TABS
1.0000 | ORAL_TABLET | ORAL | Status: DC | PRN
  Administered 2020-05-25 – 2020-05-26 (×3): 2 via ORAL
  Filled 2020-05-25 (×3): qty 2

## 2020-05-25 MED ORDER — BUPIVACAINE IN DEXTROSE 0.75-8.25 % IT SOLN
INTRATHECAL | Status: DC | PRN
  Administered 2020-05-25: 1.6 mL via INTRATHECAL

## 2020-05-25 MED ORDER — MENTHOL 3 MG MT LOZG: 1.0000 | LOZENGE | OROMUCOSAL | Status: DC | PRN

## 2020-05-25 MED ORDER — MIDAZOLAM HCL 5 MG/5ML IJ SOLN
INTRAMUSCULAR | Status: DC | PRN
  Administered 2020-05-25: 2 mg via INTRAVENOUS

## 2020-05-25 MED ORDER — METHOCARBAMOL 500 MG PO TABS
500.0000 mg | ORAL_TABLET | Freq: Four times a day (QID) | ORAL | Status: DC | PRN
  Administered 2020-05-25 – 2020-05-26 (×4): 500 mg via ORAL
  Filled 2020-05-25 (×4): qty 1

## 2020-05-25 MED ORDER — ACETAMINOPHEN 10 MG/ML IV SOLN: 1000.0000 mg | Freq: Once | INTRAVENOUS | Status: DC | PRN

## 2020-05-25 MED ORDER — DEXAMETHASONE SODIUM PHOSPHATE 10 MG/ML IJ SOLN
8.0000 mg | Freq: Once | INTRAMUSCULAR | Status: AC
  Administered 2020-05-25: 8 mg via INTRAVENOUS

## 2020-05-25 MED ORDER — BUPIVACAINE HCL 0.25 % IJ SOLN
INTRAMUSCULAR | Status: DC | PRN
  Administered 2020-05-25: 30 mL

## 2020-05-25 MED ORDER — METHOCARBAMOL 500 MG IVPB - SIMPLE MED
INTRAVENOUS | Status: AC
  Administered 2020-05-25: 500 mg via INTRAVENOUS
  Filled 2020-05-25: qty 50

## 2020-05-25 MED ORDER — INSULIN ASPART 100 UNIT/ML ~~LOC~~ SOLN: 0.0000 [IU] | Freq: Every day | SUBCUTANEOUS | Status: DC

## 2020-05-25 MED ORDER — DEXAMETHASONE SODIUM PHOSPHATE 10 MG/ML IJ SOLN
10.0000 mg | Freq: Once | INTRAMUSCULAR | Status: AC
  Administered 2020-05-26: 10 mg via INTRAVENOUS
  Filled 2020-05-25: qty 1

## 2020-05-25 MED ORDER — RIVAROXABAN 10 MG PO TABS
10.0000 mg | ORAL_TABLET | Freq: Every day | ORAL | Status: DC
  Administered 2020-05-26: 10 mg via ORAL
  Filled 2020-05-25: qty 1

## 2020-05-25 MED ORDER — MAGNESIUM CITRATE PO SOLN: 1.0000 | Freq: Once | ORAL | Status: DC | PRN

## 2020-05-25 MED ORDER — ONDANSETRON HCL 4 MG/2ML IJ SOLN
INTRAMUSCULAR | Status: DC | PRN
  Administered 2020-05-25: 4 mg via INTRAVENOUS

## 2020-05-25 MED ORDER — ACETAMINOPHEN 10 MG/ML IV SOLN
1000.0000 mg | Freq: Four times a day (QID) | INTRAVENOUS | Status: DC
  Administered 2020-05-25: 1000 mg via INTRAVENOUS
  Filled 2020-05-25: qty 100

## 2020-05-25 MED ORDER — LIP MEDEX EX OINT
TOPICAL_OINTMENT | CUTANEOUS | Status: DC | PRN
  Filled 2020-05-25: qty 7

## 2020-05-25 MED ORDER — PROPOFOL 500 MG/50ML IV EMUL
INTRAVENOUS | Status: DC | PRN
  Administered 2020-05-25: 75 ug/kg/min via INTRAVENOUS

## 2020-05-25 MED ORDER — FENTANYL CITRATE (PF) 100 MCG/2ML IJ SOLN
INTRAMUSCULAR | Status: AC
  Administered 2020-05-25: 50 ug via INTRAVENOUS
  Filled 2020-05-25: qty 2

## 2020-05-25 MED ORDER — INSULIN ASPART 100 UNIT/ML ~~LOC~~ SOLN: 0.0000 [IU] | Freq: Three times a day (TID) | SUBCUTANEOUS | Status: DC

## 2020-05-25 MED ORDER — LIDOCAINE 2% (20 MG/ML) 5 ML SYRINGE
INTRAMUSCULAR | Status: DC | PRN
  Administered 2020-05-25: 60 mg via INTRAVENOUS

## 2020-05-25 MED ORDER — POLYETHYLENE GLYCOL 3350 17 G PO PACK: 17.0000 g | PACK | Freq: Every day | ORAL | Status: DC | PRN

## 2020-05-25 MED ORDER — BUPIVACAINE HCL 0.25 % IJ SOLN
INTRAMUSCULAR | Status: AC
  Filled 2020-05-25: qty 1

## 2020-05-25 MED ORDER — LACTATED RINGERS IV SOLN: INTRAVENOUS | Status: DC

## 2020-05-25 MED ORDER — WATER FOR IRRIGATION, STERILE IR SOLN
Status: DC | PRN
  Administered 2020-05-25: 2000 mL

## 2020-05-25 SURGICAL SUPPLY — 47 items
BAG DECANTER FOR FLEXI CONT (MISCELLANEOUS) IMPLANT
BAG ZIPLOCK 12X15 (MISCELLANEOUS) IMPLANT
BLADE SAG 18X100X1.27 (BLADE) ×3 IMPLANT
CLOSURE WOUND 1/2 X4 (GAUZE/BANDAGES/DRESSINGS) ×2
COVER PERINEAL POST (MISCELLANEOUS) ×3 IMPLANT
COVER SURGICAL LIGHT HANDLE (MISCELLANEOUS) ×3 IMPLANT
COVER WAND RF STERILE (DRAPES) IMPLANT
CUP ACET PINNACLE SECTR 50MM (Hips) ×1 IMPLANT
DECANTER SPIKE VIAL GLASS SM (MISCELLANEOUS) ×3 IMPLANT
DRAPE STERI IOBAN 125X83 (DRAPES) ×3 IMPLANT
DRAPE U-SHAPE 47X51 STRL (DRAPES) ×6 IMPLANT
DRSG ADAPTIC 3X8 NADH LF (GAUZE/BANDAGES/DRESSINGS) ×3 IMPLANT
DRSG AQUACEL AG ADV 3.5X10 (GAUZE/BANDAGES/DRESSINGS) ×3 IMPLANT
DURAPREP 26ML APPLICATOR (WOUND CARE) ×3 IMPLANT
ELECT REM PT RETURN 15FT ADLT (MISCELLANEOUS) ×3 IMPLANT
EVACUATOR 1/8 PVC DRAIN (DRAIN) IMPLANT
FEM STEM 12/14 TAPER SZ 4 HIP (Orthopedic Implant) ×3 IMPLANT
FEMORAL STEM 12/14 TPR SZ4 HIP (Orthopedic Implant) ×1 IMPLANT
GLOVE BIO SURGEON STRL SZ 6 (GLOVE) IMPLANT
GLOVE BIO SURGEON STRL SZ7 (GLOVE) IMPLANT
GLOVE BIO SURGEON STRL SZ8 (GLOVE) ×3 IMPLANT
GLOVE BIOGEL PI IND STRL 6.5 (GLOVE) IMPLANT
GLOVE BIOGEL PI IND STRL 7.0 (GLOVE) IMPLANT
GLOVE BIOGEL PI IND STRL 8 (GLOVE) ×1 IMPLANT
GLOVE BIOGEL PI INDICATOR 6.5 (GLOVE)
GLOVE BIOGEL PI INDICATOR 7.0 (GLOVE)
GLOVE BIOGEL PI INDICATOR 8 (GLOVE) ×2
GOWN STRL REUS W/TWL LRG LVL3 (GOWN DISPOSABLE) ×3 IMPLANT
GOWN STRL REUS W/TWL XL LVL3 (GOWN DISPOSABLE) IMPLANT
HEAD FEMORAL 32 CERAMIC (Hips) ×3 IMPLANT
HOLDER FOLEY CATH W/STRAP (MISCELLANEOUS) ×3 IMPLANT
KIT TURNOVER KIT A (KITS) IMPLANT
LINER MARATHON 32 50 (Hips) ×3 IMPLANT
MANIFOLD NEPTUNE II (INSTRUMENTS) ×3 IMPLANT
PACK ANTERIOR HIP CUSTOM (KITS) ×3 IMPLANT
PENCIL SMOKE EVACUATOR COATED (MISCELLANEOUS) ×3 IMPLANT
PINNACLE SECTOR CUP 50MM (Hips) ×3 IMPLANT
STRIP CLOSURE SKIN 1/2X4 (GAUZE/BANDAGES/DRESSINGS) ×4 IMPLANT
SUT ETHIBOND NAB CT1 #1 30IN (SUTURE) ×3 IMPLANT
SUT MNCRL AB 4-0 PS2 18 (SUTURE) ×3 IMPLANT
SUT STRATAFIX 0 PDS 27 VIOLET (SUTURE) ×3
SUT VIC AB 2-0 CT1 27 (SUTURE) ×4
SUT VIC AB 2-0 CT1 TAPERPNT 27 (SUTURE) ×2 IMPLANT
SUTURE STRATFX 0 PDS 27 VIOLET (SUTURE) ×1 IMPLANT
SYR 50ML LL SCALE MARK (SYRINGE) IMPLANT
TRAY FOLEY MTR SLVR 16FR STAT (SET/KITS/TRAYS/PACK) ×3 IMPLANT
YANKAUER SUCT BULB TIP 10FT TU (MISCELLANEOUS) ×3 IMPLANT

## 2020-05-25 NOTE — Discharge Instructions (Addendum)
Gaynelle Arabian, MD Total Joint Specialist [facility] [address] [place], [place] [address] [phone]  ANTERIOR APPROACH TOTAL HIP REPLACEMENT POSTOPERATIVE DIRECTIONS     Hip Rehabilitation, Guidelines Following Surgery  The results of a hip operation are greatly improved after range of motion and muscle strengthening exercises. Follow all safety measures which are given to protect your hip. If any of these exercises cause increased pain or swelling in your joint, decrease the amount until you are comfortable again. Then slowly increase the exercises. Call your caregiver if you have problems or questions.   BLOOD CLOT PREVENTION . Take a 10 mg Xarelto once a day for three weeks following surgery. Then take an 81 mg Aspirin once a day for three weeks. Then discontinue Aspirin. . You may resume your vitamins/supplements once you have discontinued the Xarelto. . Do not take any NSAIDs (Advil, Aleve, Ibuprofen, Meloxicam, etc.) until you have discontinued the Xarelto.   HOME CARE INSTRUCTIONS  . Remove items at home which could result in a fall. This includes throw rugs or furniture in walking pathways.   ICE to the affected hip as frequently as 20-30 minutes an hour and then as needed for pain and swelling. Continue to use ice on the hip for pain and swelling from surgery. You may notice swelling that will progress down to the foot and ankle. This is normal after surgery. Elevate the leg when you are not up walking on it.    Continue to use the breathing machine which will help keep your temperature down.  It is common for your temperature to cycle up and down following surgery, especially at night when you are not up moving around and exerting yourself.  The breathing machine keeps your lungs expanded and your temperature down.  DIET You may resume your previous home diet once your are discharged from the hospital.  DRESSING / WOUND CARE /  SHOWERING . You have an adhesive waterproof bandage over the incision. Leave this in place until your first follow-up appointment. Once you remove this you will not need to place another bandage.  . You may begin showering 3 days following surgery, but do not submerge the incision under water.  ACTIVITY . For the first 3-5 days, it is important to rest and keep the operative leg elevated. You should, as a general rule, rest for 50 minutes and walk/stretch for 10 minutes per hour. After 5 days, you may slowly increase activity as tolerated.  Marland Kitchen Perform the exercises you were provided twice a day for about 15-20 minutes each session. Begin these 2 days following surgery. . Walk with your walker as instructed. Use the walker until you are comfortable transitioning to a cane. Walk with the cane in the opposite hand of the operative leg. You may discontinue the cane once you are comfortable and walking steadily. . Avoid periods of inactivity such as sitting longer than an hour when not asleep. This helps prevent blood clots.  . Do not drive a car for 6 weeks or until released by your surgeon.  . Do not drive while taking narcotics.  TED HOSE STOCKINGS Wear the elastic stockings on both legs for three weeks following surgery during the day. You may remove them at night while sleeping.  WEIGHT BEARING Weight bearing as tolerated with assist device (walker, cane, etc) as directed, use it as long as suggested by your surgeon or therapist, typically at least 4-6 weeks.  POSTOPERATIVE CONSTIPATION PROTOCOL Constipation -  defined medically as fewer than three stools per week and severe constipation as less than one stool per week.  One of the most common issues patients have following surgery is constipation.  Even if you have a regular bowel pattern at home, your normal regimen is likely to be disrupted due to multiple reasons following surgery.  Combination of anesthesia, postoperative narcotics, change in  appetite and fluid intake all can affect your bowels.  In order to avoid complications following surgery, here are some recommendations in order to help you during your recovery period.  . Colace (docusate) - Pick up an over-the-counter form of Colace or another stool softener and take twice a day as long as you are requiring postoperative pain medications.  Take with a full glass of water daily.  If you experience loose stools or diarrhea, hold the colace until you stool forms back up.  If your symptoms do not get better within 1 week or if they get worse, check with your doctor. . Dulcolax (bisacodyl) - Pick up over-the-counter and take as directed by the product packaging as needed to assist with the movement of your bowels.  Take with a full glass of water.  Use this product as needed if not relieved by Colace only.  . MiraLax (polyethylene glycol) - Pick up over-the-counter to have on hand.  MiraLax is a solution that will increase the amount of water in your bowels to assist with bowel movements.  Take as directed and can mix with a glass of water, juice, soda, coffee, or tea.  Take if you go more than two days without a movement.Do not use MiraLax more than once per day. Call your doctor if you are still constipated or irregular after using this medication for 7 days in a row.  If you continue to have problems with postoperative constipation, please contact the office for further assistance and recommendations.  If you experience "the worst abdominal pain ever" or develop nausea or vomiting, please contact the office immediatly for further recommendations for treatment.  ITCHING  If you experience itching with your medications, try taking only a single pain pill, or even half a pain pill at a time.  You can also use Benadryl over the counter for itching or also to help with sleep.   MEDICATIONS See your medication summary on the "After Visit Summary" that the nursing staff will review with you  prior to discharge.  You may have some home medications which will be placed on hold until you complete the course of blood thinner medication.  It is important for you to complete the blood thinner medication as prescribed by your surgeon.  Continue your approved medications as instructed at time of discharge.  PRECAUTIONS If you experience chest pain or shortness of breath - call 911 immediately for transfer to the hospital emergency department.  If you develop a fever greater that 101 F, purulent drainage from wound, increased redness or drainage from wound, foul odor from the wound/dressing, or calf pain - CONTACT YOUR SURGEON.                                                   FOLLOW-UP APPOINTMENTS Make sure you keep all of your appointments after your operation with your surgeon and caregivers. You should call the office at the above phone number and   make an appointment for approximately two weeks after the date of your surgery or on the date instructed by your surgeon outlined in the "After Visit Summary".  RANGE OF MOTION AND STRENGTHENING EXERCISES  These exercises are designed to help you keep full movement of your hip joint. Follow your caregiver's or physical therapist's instructions. Perform all exercises about fifteen times, three times per day or as directed. Exercise both hips, even if you have had only one joint replacement. These exercises can be done on a training (exercise) mat, on the floor, on a table or on a bed. Use whatever works the best and is most comfortable for you. Use music or television while you are exercising so that the exercises are a pleasant break in your day. This will make your life better with the exercises acting as a break in routine you can look forward to.  . Lying on your back, slowly slide your foot toward your buttocks, raising your knee up off the floor. Then slowly slide your foot back down until your leg is straight again.  . Lying on your back spread  your legs as far apart as you can without causing discomfort.  . Lying on your side, raise your upper leg and foot straight up from the floor as far as is comfortable. Slowly lower the leg and repeat.  . Lying on your back, tighten up the muscle in the front of your thigh (quadriceps muscles). You can do this by keeping your leg straight and trying to raise your heel off the floor. This helps strengthen the largest muscle supporting your knee.  . Lying on your back, tighten up the muscles of your buttocks both with the legs straight and with the knee bent at a comfortable angle while keeping your heel on the floor.   IF YOU ARE TRANSFERRED TO A SKILLED REHAB FACILITY If the patient is transferred to a skilled rehab facility following release from the hospital, a list of the current medications will be sent to the facility for the patient to continue.  When discharged from the skilled rehab facility, please have the facility set up the patient's Home Health Physical Therapy prior to being released. Also, the skilled facility will be responsible for providing the patient with their medications at time of release from the facility to include their pain medication, the muscle relaxants, and their blood thinner medication. If the patient is still at the rehab facility at time of the two week follow up appointment, the skilled rehab facility will also need to assist the patient in arranging follow up appointment in our office and any transportation needs.  MAKE SURE YOU:  . Understand these instructions.  . Get help right away if you are not doing well or get worse.    DENTAL ANTIBIOTICS:  In most cases prophylactic antibiotics for Dental procdeures after total joint surgery are not necessary.  Exceptions are as follows:  1. History of prior total joint infection  2. Severely immunocompromised (Organ Transplant, cancer chemotherapy, Rheumatoid biologic meds such as Humera)  3. Poorly controlled  diabetes (A1C &gt; 8.0, blood glucose over 200)  If you have one of these conditions, contact your surgeon for an antibiotic prescription, prior to your dental procedure.    Pick up stool softner and laxative for home use following surgery while on pain medications. Do not submerge incision under water. Please use good hand washing techniques while changing dressing each day. May shower starting three days after surgery. Please   use a clean towel to pat the incision dry following showers. Continue to use ice for pain and swelling after surgery. Do not use any lotions or creams on the incision until instructed by your surgeon.  Information on my medicine - XARELTO (Rivaroxaban)  This medication education was reviewed with me or my healthcare representative as part of my discharge preparation.  The pharmacist that spoke with me during my hospital stay was:    Why was Xarelto prescribed for you? Xarelto was prescribed for you to reduce the risk of blood clots forming after orthopedic surgery. The medical term for these abnormal blood clots is venous thromboembolism (VTE).  What do you need to know about xarelto ? Take your Xarelto ONCE DAILY at the same time every day. You may take it either with or without food.  If you have difficulty swallowing the tablet whole, you may crush it and mix in applesauce just prior to taking your dose.  Take Xarelto exactly as prescribed by your doctor and DO NOT stop taking Xarelto without talking to the doctor who prescribed the medication.  Stopping without other VTE prevention medication to take the place of Xarelto may increase your risk of developing a clot.  After discharge, you should have regular check-up appointments with your healthcare provider that is prescribing your Xarelto.    What do you do if you miss a dose? If you miss a dose, take it as soon as you remember on the same day then continue your regularly scheduled once daily  regimen the next day. Do not take two doses of Xarelto on the same day.   Important Safety Information A possible side effect of Xarelto is bleeding. You should call your healthcare provider right away if you experience any of the following: ? Bleeding from an injury or your nose that does not stop. ? Unusual colored urine (red or dark brown) or unusual colored stools (red or black). ? Unusual bruising for unknown reasons. ? A serious fall or if you hit your head (even if there is no bleeding).  Some medicines may interact with Xarelto and might increase your risk of bleeding while on Xarelto. To help avoid this, consult your healthcare provider or pharmacist prior to using any new prescription or non-prescription medications, including herbals, vitamins, non-steroidal anti-inflammatory drugs (NSAIDs) and supplements.  This website has more information on Xarelto: VisitDestination.com.br.

## 2020-05-25 NOTE — Evaluation (Signed)
Physical Therapy Evaluation Patient Details Name: Robyn Johnston MRN: [id number] DOB: 04/05/1951 Today's Date: 05/25/2020   History of Present Illness  s/p L DATHA with history of back surgery.  Clinical Impression  Pt is s/p L DA THA resulting in the deficits listed below (see PT Problem List). Pt will benefit from acute PT to increase their independence and safety with mobility to allow discharge safely home with husband assisting. Pt with fair amount of pain and some diaphoretic with movement ( sweating, hot), therefore limited distance and only transferred to recliner tonight.      Follow Up Recommendations Follow surgeons recommendation for DC plan and follow-up therapies    Equipment Recommendations  None recommended by PT    Recommendations for Other Services       Precautions / Restrictions Precautions Precautions: None Restrictions Weight Bearing Restrictions: No      Mobility  Bed Mobility Overal bed mobility: Needs Assistance Bed Mobility: Supine to Sit;Sit to Supine     Supine to sit: Min assist        Transfers Overall transfer level: Needs assistance Equipment used: Rolling walker (2 wheeled) Transfers: Sit to/from Stand Sit to Stand: Min assist            Ambulation/Gait Ambulation/Gait assistance: Min assist Gait Distance (Feet): 5 Feet (small steps in room to pivot) Assistive device: Rolling walker (2 wheeled) Gait Pattern/deviations: Step-to pattern        Stairs            Wheelchair Mobility    Modified Rankin (Stroke Patients Only)       Balance Overall balance assessment: Needs assistance Sitting-balance support: Feet supported;No upper extremity supported Sitting balance-Leahy Scale: Normal     Standing balance support: During functional activity;Bilateral upper extremity supported Standing balance-Leahy Scale: Good                               Pertinent Vitals/Pain Pain Assessment: 0-10 Pain Score:  7  Pain Location: L Lateral area of the hip Pain Descriptors / Indicators: Burning;Sore Pain Intervention(s): Limited activity within patient's tolerance;Monitored during session;Premedicated before session;Ice applied;Repositioned    Home Living Family/patient expects to be discharged to:: Private residence Living Arrangements: Spouse/significant other Available Help at Discharge: Family Type of Home: House Home Access: Stairs to enter   Secretary/administrator of Steps: 1 Home Layout: One level Home Equipment: Environmental consultant - 2 wheels      Prior Function Level of Independence: Independent         Comments: has had more difficulty in last month and occassional sleeps in recliner  ( but not at night)     Hand Dominance        Extremity/Trunk Assessment   Upper Extremity Assessment Upper Extremity Assessment: Overall WFL for tasks assessed    Lower Extremity Assessment Lower Extremity Assessment: Overall WFL for tasks assessed       Communication   Communication: No difficulties  Cognition Arousal/Alertness: Awake/alert Behavior During Therapy: WFL for tasks assessed/performed Overall Cognitive Status: Within Functional Limits for tasks assessed                                        General Comments      Exercises Total Joint Exercises Ankle Circles/Pumps: AROM;Both;10 reps;Supine Quad Sets: AROM;Supine;Left;10 reps Heel Slides: AAROM;Left;5 reps;Supine Hip ABduction/ADduction: AAROM;Supine;Left;5  reps   Assessment/Plan    PT Assessment Patient needs continued PT services  PT Problem List Decreased strength;Decreased mobility;Decreased activity tolerance;Decreased balance;Decreased range of motion       PT Treatment Interventions DME instruction;Gait training;Stair training;Functional mobility training;Therapeutic activities;Therapeutic exercise    PT Goals (Current goals can be found in the Care Plan section)  Acute Rehab PT Goals Patient  Stated Goal: to get aroud better with less pain PT Goal Formulation: With patient Time For Goal Achievement: 06/08/20 Potential to Achieve Goals: Good    Frequency 7X/week   Barriers to discharge        Co-evaluation               AM-PAC PT "6 Clicks" Mobility  Outcome Measure Help needed turning from your back to your side while in a flat bed without using bedrails?: A Little Help needed moving from lying on your back to sitting on the side of a flat bed without using bedrails?: A Little Help needed moving to and from a bed to a chair (including a wheelchair)?: A Little Help needed standing up from a chair using your arms (e.g., wheelchair or bedside chair)?: A Little Help needed to walk in hospital room?: A Little Help needed climbing 3-5 steps with a railing? : A Little 6 Click Score: 18    End of Session Equipment Utilized During Treatment: Gait belt Activity Tolerance: Patient limited by pain Patient left: in chair;with call bell/phone within reach;with chair alarm set;with family/visitor present Nurse Communication: Mobility status PT Visit Diagnosis: Other abnormalities of gait and mobility (R26.89)    Time: 1823-1900 PT Time Calculation (min) (ACUTE ONLY): 37 min   Charges:   PT Evaluation $PT Eval Low Complexity: 1 Low PT Treatments $Therapeutic Activity: 8-22 mins        Sham Alviar, PT, MPT Acute Rehabilitation Services Office: [phone] Pager: [phone] 05/25/2020   Clide Dales 05/25/2020, 7:06 PM

## 2020-05-25 NOTE — Anesthesia Procedure Notes (Signed)
Spinal  Patient location during procedure: OR Start time: 05/25/2020 1:15 PM End time: 05/25/2020 1:37 PM Staffing Performed: resident/CRNA  Anesthesiologist: Barnet Glasgow, MD Resident/CRNA: Lavina Hamman, CRNA Preanesthetic Checklist Completed: patient identified, IV checked, site marked, risks and benefits discussed, surgical consent, monitors and equipment checked, pre-op evaluation and timeout performed Spinal Block Patient position: sitting Prep: DuraPrep Patient monitoring: heart rate, cardiac monitor, continuous pulse ox and blood pressure Approach: midline Location: L3-4 Injection technique: single-shot Needle Needle type: Sprotte and Pencan  Needle gauge: 24 G Needle length: 9 cm Needle insertion depth: 7 cm Assessment Sensory level: T4 Additional Notes IV functioning, monitors applied to pt. Expiration date of kit checked and confirmed to be in date. Sterile prep and drape, hand hygiene and sterile gloved used. Pt was positioned and spine was prepped in sterile fashion. Skin was anesthetized with lidocaine. Free flow of clear CSF obtained prior to injecting local anesthetic into CSF x 1 attempt. Spinal needle aspirated freely following injection. Needle was carefully withdrawn, and pt tolerated procedure well. Loss of motor and sensory on exam post injection.

## 2020-05-25 NOTE — Interval H&P Note (Signed)
History and Physical Interval Note:  05/25/2020 12:11 PM  Robyn Johnston  has presented today for surgery, with the diagnosis of left hip osteoarthritis.  The various methods of treatment have been discussed with the patient and family. After consideration of risks, benefits and other options for treatment, the patient has consented to  Procedure(s) with comments: TOTAL HIP ARTHROPLASTY ANTERIOR APPROACH (Left) - as a surgical intervention.  The patient's history has been reviewed, patient examined, no change in status, stable for surgery.  I have reviewed the patient's chart and labs.  Questions were answered to the patient's satisfaction.     Homero Fellers Jarron Curley

## 2020-05-25 NOTE — Anesthesia Preprocedure Evaluation (Signed)
Anesthesia Evaluation  Patient identified by MRN, date of birth, ID band Patient awake    Reviewed: Allergy & Precautions, NPO status , Patient's Chart, lab work & pertinent test results  History of Anesthesia Complications (+) PONV  Airway Mallampati: II  TM Distance: >3 FB Neck ROM: Full    Dental no notable dental hx. (+) Partial Upper, Partial Lower,    Pulmonary former smoker,    Pulmonary exam normal breath sounds clear to auscultation       Cardiovascular hypertension, Pt. on medications Normal cardiovascular exam Rhythm:Regular Rate:Normal     Neuro/Psych negative neurological ROS  negative psych ROS   GI/Hepatic Neg liver ROS, GERD  ,  Endo/Other  diabetes  Renal/GU negative Renal ROS     Musculoskeletal  (+) Arthritis ,   Abdominal   Peds  Hematology Lab Results      Component                Value               Date                      WBC                      5.7                 05/20/2020                HGB                      11.8 (L)            05/20/2020                HCT                      35.6 (L)            05/20/2020                MCV                      99.4                05/20/2020                PLT                      215                 05/20/2020              Anesthesia Other Findings   Reproductive/Obstetrics negative OB ROS                            Anesthesia Physical Anesthesia Plan  ASA: II  Anesthesia Plan: Spinal   Post-op Pain Management:    Induction:   PONV Risk Score and Plan: 3 and Ondansetron, Dexamethasone and Treatment may vary due to age or medical condition  Airway Management Planned: Nasal Cannula and Natural Airway  Additional Equipment:   Intra-op Plan:   Post-operative Plan:   Informed Consent: I have reviewed the patients History and Physical, chart, labs and discussed the procedure including the risks, benefits  and alternatives for the proposed anesthesia with the patient or authorized representative  who has indicated his/her understanding and acceptance.     Dental advisory given  Plan Discussed with:   Anesthesia Plan Comments:        Anesthesia Quick Evaluation

## 2020-05-25 NOTE — Op Note (Signed)
OPERATIVE REPORT- TOTAL HIP ARTHROPLASTY   PREOPERATIVE DIAGNOSIS: Osteoarthritis of the Left hip.   POSTOPERATIVE DIAGNOSIS: Osteoarthritis of the Left  hip.   PROCEDURE: Left total hip arthroplasty, anterior approach.   SURGEON: Ollen Gross, MD   ASSISTANT: Arther Abbott, PA-C  ANESTHESIA:  Spinal  ESTIMATED BLOOD LOSS:-250 mL    DRAINS: Hemovac x1.   COMPLICATIONS: None   CONDITION: PACU - hemodynamically stable.   BRIEF CLINICAL NOTE: Robyn Johnston is a 69 y.o. female who has advanced end-  stage arthritis of their Left  hip with progressively worsening pain and  dysfunction.The patient has failed nonoperative management and presents for  total hip arthroplasty.   PROCEDURE IN DETAIL: After successful administration of spinal  anesthetic, the traction boots for the [facility] bed were placed on both  feet and the patient was placed onto the [employer] bed, boots placed into the leg  holders. The Left hip was then isolated from the perineum with plastic  drapes and prepped and draped in the usual sterile fashion. ASIS and  greater trochanter were marked and a oblique incision was made, starting  at about 1 cm lateral and 2 cm distal to the ASIS and coursing towards  the anterior cortex of the femur. The skin was cut with a 10 blade  through subcutaneous tissue to the level of the fascia overlying the  tensor fascia lata muscle. The fascia was then incised in line with the  incision at the junction of the anterior third and posterior 2/3rd. The  muscle was teased off the fascia and then the interval between the TFL  and the rectus was developed. The Hohmann retractor was then placed at  the top of the femoral neck over the capsule. The vessels overlying the  capsule were cauterized and the fat on top of the capsule was removed.  A Hohmann retractor was then placed anterior underneath the rectus  femoris to give exposure to the entire anterior capsule. A T-shaped   capsulotomy was performed. The edges were tagged and the femoral head  was identified.       Osteophytes are removed off the superior acetabulum.  The femoral neck was then cut in situ with an oscillating saw. Traction  was then applied to the left lower extremity utilizing the [employer]  traction. The femoral head was then removed. Retractors were placed  around the acetabulum and then circumferential removal of the labrum was  performed. Osteophytes were also removed. Reaming starts at 45 mm to  medialize and  Increased in 2 mm increments to 47 mm. We reamed in  approximately 40 degrees of abduction, 20 degrees anteversion. A 48 mm  pinnacle acetabular shell was then impacted in anatomic position under  fluoroscopic guidance with excellent purchase. We did not need to place  any additional dome screws. A 28 mm neutral + 4 marathon liner was then  placed into the acetabular shell.       The femoral lift was then placed along the lateral aspect of the femur  just distal to the vastus ridge. The leg was  externally rotated and capsule  was stripped off the inferior aspect of the femoral neck down to the  level of the lesser trochanter, this was done with electrocautery. The femur was lifted after this was performed. The  leg was then placed in an extended and adducted position essentially delivering the femur. We also removed the capsule superiorly and the piriformis from the piriformis fossa  to gain excellent exposure of the  proximal femur. Rongeur was used to remove some cancellous bone to get  into the lateral portion of the proximal femur for placement of the  initial starter reamer. The starter broaches was placed  the starter broach  and was shown to go down the center of the canal. Broaching  with the Actis system was then performed starting at size 0  coursing  Up to size 4. A size 4 had excellent torsional and rotational  and axial stability. The trial high offset neck was then placed   with a 32 + 1 trial head. The hip was then reduced. We confirmed that  the stem was in the canal both on AP and lateral x-rays. It also has excellent sizing. The hip was reduced with outstanding stability through full extension and full external rotation.. AP pelvis was taken and the leg lengths were measured and found to be equal. Hip was then dislocated again and the femoral head and neck removed. The  femoral broach was removed. Size 4 Actis stem with a high offset  neck was then impacted into the femur following native anteversion. Has  excellent purchase in the canal. Excellent torsional and rotational and  axial stability. It is confirmed to be in the canal on AP and lateral  fluoroscopic views. The 32 + 1 ceramic head was placed and the hip  reduced with outstanding stability. Again AP pelvis was taken and it  confirmed that the leg lengths were equal. The wound was then copiously  irrigated with saline solution and the capsule reattached and repaired  with Ethibond suture. 30 ml of .25% Bupivicaine was  injected into the capsule and into the edge of the tensor fascia lata as well as subcutaneous tissue. The fascia overlying the tensor fascia lata was then closed with a running #1 V-Loc. Subcu was closed with interrupted 2-0 Vicryl and subcuticular running 4-0 Monocryl. Incision was cleaned  and dried. Steri-Strips and a bulky sterile dressing applied. Hemovac  drain was hooked to suction and then the patient was awakened and transported to  recovery in stable condition.        Please note that a surgical assistant was a medical necessity for this procedure to perform it in a safe and expeditious manner. Assistant was necessary to provide appropriate retraction of vital neurovascular structures and to prevent femoral fracture and allow for anatomic placement of the prosthesis.  Gaynelle Arabian, M.D.

## 2020-05-26 ENCOUNTER — Encounter (HOSPITAL_COMMUNITY): Payer: Self-pay | Admitting: Orthopedic Surgery

## 2020-05-26 DIAGNOSIS — M1612 Unilateral primary osteoarthritis, left hip: Secondary | ICD-10-CM | POA: Diagnosis not present

## 2020-05-26 LAB — CBC
HCT: 31.9 % — ABNORMAL LOW (ref 36.0–46.0)
Hemoglobin: 10.7 g/dL — ABNORMAL LOW (ref 12.0–15.0)
MCH: 32.7 pg (ref 26.0–34.0)
MCHC: 33.5 g/dL (ref 30.0–36.0)
MCV: 97.6 fL (ref 80.0–100.0)
Platelets: 177 10*3/uL (ref 150–400)
RBC: 3.27 MIL/uL — ABNORMAL LOW (ref 3.87–5.11)
RDW: 12.1 % (ref 11.5–15.5)
WBC: 6.6 10*3/uL (ref 4.0–10.5)
nRBC: 0 % (ref 0.0–0.2)

## 2020-05-26 LAB — BASIC METABOLIC PANEL
Anion gap: 13 (ref 5–15)
BUN: 12 mg/dL (ref 8–23)
CO2: 22 mmol/L (ref 22–32)
Calcium: 8.6 mg/dL — ABNORMAL LOW (ref 8.9–10.3)
Chloride: 102 mmol/L (ref 98–111)
Creatinine, Ser: 0.87 mg/dL (ref 0.44–1.00)
GFR calc Af Amer: >60 mL/min (ref >60)
GFR calc non Af Amer: >60 mL/min (ref >60)
Glucose, Bld: 130 mg/dL — ABNORMAL HIGH (ref 70–99)
Potassium: 3.3 mmol/L — ABNORMAL LOW (ref 3.5–5.1)
Sodium: 137 mmol/L (ref 135–145)

## 2020-05-26 LAB — GLUCOSE, CAPILLARY
Glucose-Capillary: 79 mg/dL (ref 70–99)
Glucose-Capillary: 118 mg/dL — ABNORMAL HIGH (ref 70–99)

## 2020-05-26 MED ORDER — TRAMADOL HCL 50 MG PO TABS: 50.0000 mg | ORAL_TABLET | Freq: Four times a day (QID) | ORAL | 0 refills | Status: DC | PRN

## 2020-05-26 MED ORDER — OXYCODONE HCL 5 MG PO TABS: 5.0000 mg | ORAL_TABLET | Freq: Four times a day (QID) | ORAL | 0 refills | Status: DC | PRN

## 2020-05-26 MED ORDER — OXYCODONE HCL 5 MG PO TABS
5.0000 mg | ORAL_TABLET | ORAL | Status: DC | PRN
  Administered 2020-05-26 (×2): 10 mg via ORAL
  Filled 2020-05-26 (×2): qty 2

## 2020-05-26 MED ORDER — RIVAROXABAN 10 MG PO TABS: 10.0000 mg | ORAL_TABLET | Freq: Every day | ORAL | 0 refills | Status: DC

## 2020-05-26 MED ORDER — POTASSIUM CHLORIDE CRYS ER 20 MEQ PO TBCR
40.0000 meq | EXTENDED_RELEASE_TABLET | Freq: Once | ORAL | Status: AC
  Administered 2020-05-26: 40 meq via ORAL
  Filled 2020-05-26: qty 2

## 2020-05-26 MED ORDER — METHOCARBAMOL 500 MG PO TABS: 500.0000 mg | ORAL_TABLET | Freq: Four times a day (QID) | ORAL | 0 refills | Status: DC | PRN

## 2020-05-26 NOTE — Progress Notes (Signed)
Physical Therapy Treatment Patient Details Name: Robyn Johnston MRN: [id number] DOB: March 29, 1951 Today's Date: 05/26/2020    History of Present Illness s/p L DATHA with history of back surgery.    PT Comments    Pt very cooperative and with improvement noted in activity tolerance but with pt still pain limited and requiring increased time for all tasks.   Follow Up Recommendations  Follow surgeon's recommendation for DC plan and follow-up therapies     Equipment Recommendations  None recommended by PT    Recommendations for Other Services       Precautions / Restrictions Precautions Precautions: None Restrictions Weight Bearing Restrictions: No    Mobility  Bed Mobility               General bed mobility comments: Pt up in chair and requests back to same  Transfers Overall transfer level: Needs assistance Equipment used: Rolling walker (2 wheeled) Transfers: Sit to/from Stand Sit to Stand: Min assist         General transfer comment: cues for LE management and use of UEs to self assist  Ambulation/Gait Ambulation/Gait assistance: Min assist;Min guard Gait Distance (Feet): 42 Feet Assistive device: Rolling walker (2 wheeled) Gait Pattern/deviations: Step-to pattern Gait velocity: decr   General Gait Details: Increased time with cues for sequence, posture, and position from [employer]             Wheelchair Mobility    Modified Rankin (Stroke Patients Only)       Balance Overall balance assessment: Needs assistance Sitting-balance support: Feet supported;No upper extremity supported Sitting balance-Leahy Scale: Normal     Standing balance support: During functional activity;Bilateral upper extremity supported Standing balance-Leahy Scale: Fair                              Cognition Arousal/Alertness: Awake/alert Behavior During Therapy: WFL for tasks assessed/performed Overall Cognitive Status: Within Functional Limits for  tasks assessed                                        Exercises Total Joint Exercises Ankle Circles/Pumps: AROM;Both;Supine;15 reps Quad Sets: AROM;Supine;Left;5 reps Heel Slides: AAROM;Left;Supine;20 reps Hip ABduction/ADduction: AAROM;Supine;Left;10 reps    General Comments        Pertinent Vitals/Pain Pain Assessment: 0-10 Pain Score: 5  Pain Location: L Lateral area of the hip Pain Descriptors / Indicators: Burning;Sore Pain Intervention(s): Limited activity within patient's tolerance;Monitored during session;Premedicated before session;Ice applied    Home Living                      Prior Function            PT Goals (current goals can now be found in the care plan section) Acute Rehab PT Goals Patient Stated Goal: to get aroud better with less pain PT Goal Formulation: With patient Time For Goal Achievement: 06/08/20 Potential to Achieve Goals: Good Progress towards PT goals: Progressing toward goals    Frequency    7X/week      PT Plan Current plan remains appropriate    Co-evaluation              AM-PAC PT "6 Clicks" Mobility   Outcome Measure  Help needed turning from your back to your side while in a flat bed without using bedrails?:  A Little Help needed moving from lying on your back to sitting on the side of a flat bed without using bedrails?: A Little Help needed moving to and from a bed to a chair (including a wheelchair)?: A Little Help needed standing up from a chair using your arms (e.g., wheelchair or bedside chair)?: A Little Help needed to walk in hospital room?: A Little Help needed climbing 3-5 steps with a railing? : A Little 6 Click Score: 18    End of Session Equipment Utilized During Treatment: Gait belt Activity Tolerance: Patient limited by pain Patient left: in chair;with call bell/phone within reach;with chair alarm set;with family/visitor present Nurse Communication: Mobility status PT Visit  Diagnosis: Other abnormalities of gait and mobility (R26.89)     Time: 9977-4142 PT Time Calculation (min) (ACUTE ONLY): 38 min  Charges:  $Gait Training: 23-37 mins $Therapeutic Exercise: 8-22 mins                     [place] Pager [phone] Office [phone]    Maveric Debono 05/26/2020, 12:52 PM

## 2020-05-26 NOTE — Progress Notes (Signed)
Met briefly with pt and confirmed she has all needed DME PTA and plan for HEP. No TOC needs.  Buford Gayler, LCSW

## 2020-05-26 NOTE — Progress Notes (Signed)
Physical Therapy Treatment Patient Details Name: Robyn Johnston MRN: [id number] DOB: [birth year] Today's Date: 05/26/2020    History of Present Illness s/p L DATHA with history of back surgery.    PT Comments    Pt performed HEP and reviewed written instruction including progression of program.  Pt notably having increased difficulty retaining information vs this am and asked for same instruction multiple times.  Pt states her pain meds were changed.  RN aware.  Pt requesting rest break prior to attempting gait and stairs.   Follow Up Recommendations  Follow surgeon's recommendation for DC plan and follow-up therapies     Equipment Recommendations  None recommended by PT    Recommendations for Other Services       Precautions / Restrictions Precautions Precautions: Fall Restrictions Weight Bearing Restrictions: No    Mobility  Bed Mobility               General bed mobility comments: Pt up in chair and requests back to same  Transfers Overall transfer level: Needs assistance Equipment used: Rolling walker (2 wheeled) Transfers: Sit to/from Stand Sit to Stand: Min assist         General transfer comment: cues for LE management and use of UEs to self assist  Ambulation/Gait Ambulation/Gait assistance: Min assist;Min guard Gait Distance (Feet): 42 Feet Assistive device: Rolling walker (2 wheeled) Gait Pattern/deviations: Step-to pattern Gait velocity: decr   General Gait Details: Increased time with cues for sequence, posture, and position from [employer]             Wheelchair Mobility    Modified Rankin (Stroke Patients Only)       Balance Overall balance assessment: Needs assistance Sitting-balance support: Feet supported;No upper extremity supported Sitting balance-Leahy Scale: Normal     Standing balance support: During functional activity;Bilateral upper extremity supported Standing balance-Leahy Scale: Fair                               Cognition Arousal/Alertness: Awake/alert Behavior During Therapy: WFL for tasks assessed/performed Overall Cognitive Status: Within Functional Limits for tasks assessed                                 General Comments: Pt appears to have increased difficulty processing and retaining information vs am session.  Pt states they have changed her pain meds.      Exercises Total Joint Exercises Ankle Circles/Pumps: AROM;Both;Supine;15 reps Quad Sets: AROM;Supine;Left;5 reps Heel Slides: AAROM;Left;Supine;20 reps Hip ABduction/ADduction: AAROM;Supine;Left;10 reps    General Comments        Pertinent Vitals/Pain Pain Assessment: 0-10 Pain Score: 5  Pain Location: L Lateral area of the hip Pain Descriptors / Indicators: Burning;Sore Pain Intervention(s): Limited activity within patient's tolerance;Monitored during session;Premedicated before session    Home Living                      Prior Function            PT Goals (current goals can now be found in the care plan section) Acute Rehab PT Goals Patient Stated Goal: to get around better with less pain PT Goal Formulation: With patient Time For Goal Achievement: 06/08/20 Potential to Achieve Goals: Good Progress towards PT goals: Progressing toward goals    Frequency    7X/week      PT  Plan Current plan remains appropriate    Co-evaluation              AM-PAC PT "6 Clicks" Mobility   Outcome Measure  Help needed turning from your back to your side while in a flat bed without using bedrails?: A Little Help needed moving from lying on your back to sitting on the side of a flat bed without using bedrails?: A Little Help needed moving to and from a bed to a chair (including a wheelchair)?: A Little Help needed standing up from a chair using your arms (e.g., wheelchair or bedside chair)?: A Little Help needed to walk in hospital room?: A Little Help needed climbing 3-5 steps  with a railing? : A Little 6 Click Score: 18    End of Session Equipment Utilized During Treatment: Gait belt Activity Tolerance: Patient tolerated treatment well;Patient limited by fatigue Patient left: in chair;with call bell/phone within reach;with chair alarm set Nurse Communication: Mobility status PT Visit Diagnosis: Other abnormalities of gait and mobility (R26.89)     Time: 2233-6122 PT Time Calculation (min) (ACUTE ONLY): 29 min  Charges:  $Gait Training: 23-37 mins $Therapeutic Exercise: 23-37 mins                     [place] Pager [phone] Office [phone]    Robyn Johnston 05/26/2020, 2:36 PM

## 2020-05-26 NOTE — Plan of Care (Signed)

## 2020-05-26 NOTE — Transfer of Care (Signed)
Immediate Anesthesia Transfer of Care Note  Patient: Robyn Johnston  Procedure(s) Performed: TOTAL HIP ARTHROPLASTY ANTERIOR APPROACH (Left Hip)  Patient Location: PACU  Anesthesia Type:MAC and Spinal  Level of Consciousness: awake, oriented, patient cooperative and responds to stimulation  Airway & Oxygen Therapy: Patient Spontanous Breathing and Patient connected to face mask oxygen  Post-op Assessment: Report given to RN and Post -op Vital signs reviewed and stable  Post vital signs: Reviewed and stable  Last Vitals:  Vitals Value Taken Time  BP 161/59 05/26/20 0502  Temp 36.6 C 05/26/20 0502  Pulse 79 05/26/20 0502  Resp 15 05/26/20 0502  SpO2 97 % 05/26/20 0502    Last Pain:  Vitals:   05/26/20 0715  TempSrc:   PainSc: 5       Patients Stated Pain Goal: 4 (31/51/76 1607)  Complications: No complications documented.

## 2020-05-26 NOTE — Progress Notes (Signed)
   Subjective: 1 Day Post-Op Procedure(s) (LRB): TOTAL HIP ARTHROPLASTY ANTERIOR APPROACH (Left) Patient reports pain as moderate.   Patient seen in rounds by [name]. Patient is well, and has had no acute complaints or problems other than controlling pain in the left hip. States medication has not been effective. Denies chest pain or SOB. Foley catheter removed this AM. No issues overnight.  We will continue therapy today.   Objective: Vital signs in last 24 hours: Temp:  [97.5 F (36.4 C)-98.6 F (37 C)] 97.9 F (36.6 C) (06/17 0502) Pulse Rate:  [48-99] 79 (06/17 0502) Resp:  [11-23] 15 (06/17 0502) BP: (111-161)/(55-74) 161/59 (06/17 0502) SpO2:  [96 %-100 %] 97 % (06/17 0502) Weight:  [83.5 kg] 83.5 kg (06/16 1214)  Intake/Output from previous day:  Intake/Output Summary (Last 24 hours) at 05/26/2020 0733 Last data filed at 05/26/2020 0600 Gross per 24 hour  Intake 4413.75 ml  Output 2150 ml  Net 2263.75 ml     Intake/Output this shift: No intake/output data recorded.  Labs: Recent Labs    05/26/20 0240  HGB 10.7*   Recent Labs    05/26/20 0240  WBC 6.6  RBC 3.27*  HCT 31.9*  PLT 177   Recent Labs    05/26/20 0240  NA 137  K 3.3*  CL 102  CO2 22  BUN 12  CREATININE 0.87  GLUCOSE 130*  CALCIUM 8.6*   No results for input(s): LABPT, INR in the last 72 hours.  Exam: General - Patient is Alert and Oriented Extremity - Neurologically intact Neurovascular intact Sensation intact distally Dorsiflexion/Plantar flexion intact Dressing - dressing C/D/I Motor Function - intact, moving foot and toes well on exam.   Past Medical History:  Diagnosis Date  . Arthritis   . Complication of anesthesia   . Diabetes mellitus without complication (HCC)   . GERD (gastroesophageal reflux disease)   . Hypertension   . PONV (postoperative nausea and vomiting)     Assessment/Plan: 1 Day Post-Op Procedure(s) (LRB): TOTAL HIP ARTHROPLASTY ANTERIOR  APPROACH (Left) Principal Problem:   OA (osteoarthritis) of hip Active Problems:   Primary osteoarthritis of left hip  Estimated body mass index is 32.59 kg/m as calculated from the following:   Height as of this encounter: 5\' 3"  (1.6 m).   Weight as of this encounter: 83.5 kg. Advance diet Up with therapy D/C IV fluids  DVT Prophylaxis - Xarelto Weight bearing as tolerated. D/C O2 and pulse ox and try on room air. Continue therapy.  Potassium 3.3 this AM. One dose of 40 mEq KCl ordered. Will switch PO medication norco to oxycodone 5-10 mg Q4.  Plan is to go Home after hospital stay. Plan for discharge later today once meeting goals with HEP Follow-up in the office Neita 29th.  July 01, PA-C Orthopedic Surgery [phone] 05/26/2020, 7:33 AM

## 2020-05-26 NOTE — Progress Notes (Signed)
Physical Therapy Treatment Patient Details Name: Robyn Johnston MRN: [id number] DOB: 31-Mar-1951 Today's Date: 05/26/2020    History of Present Illness s/p L DATHA with history of back surgery.    PT Comments    Pt in good spirits and with demonstrating improvement in ability to follow cues and retain information this session.  Pt ambulated increased distance in hall and negotiated stairs with min assist - written instruction provided.   Follow Up Recommendations  Follow surgeon's recommendation for DC plan and follow-up therapies     Equipment Recommendations  None recommended by PT    Recommendations for Other Services       Precautions / Restrictions Precautions Precautions: Fall Restrictions Weight Bearing Restrictions: No    Mobility  Bed Mobility               General bed mobility comments: Pt up in chair and requests back to same  Transfers Overall transfer level: Needs assistance Equipment used: Rolling walker (2 wheeled) Transfers: Sit to/from Stand Sit to Stand: Min guard;Supervision         General transfer comment: cues for LE management and use of UEs to self assist  Ambulation/Gait Ambulation/Gait assistance: Min guard;Supervision Gait Distance (Feet): 58 Feet Assistive device: Rolling walker (2 wheeled) Gait Pattern/deviations: Step-to pattern Gait velocity: decr   General Gait Details: Increased time with cues for sequence, posture, and position from RW   Stairs Stairs: Yes Stairs assistance: Min assist Stair Management: No rails;Step to pattern;Forwards;With walker Number of Stairs: 2 General stair comments: single step twice fwd with RW and cues for sequence and foot/RW placement   Wheelchair Mobility    Modified Rankin (Stroke Patients Only)       Balance Overall balance assessment: Needs assistance Sitting-balance support: Feet supported;No upper extremity supported Sitting balance-Leahy Scale: Normal     Standing balance  support: During functional activity;Bilateral upper extremity supported Standing balance-Leahy Scale: Fair                              Cognition Arousal/Alertness: Awake/alert Behavior During Therapy: WFL for tasks assessed/performed Overall Cognitive Status: Within Functional Limits for tasks assessed                                 General Comments: More clear-headed than earlier session      Exercises Total Joint Exercises Ankle Circles/Pumps: AROM;Both;Supine;15 reps Quad Sets: AROM;Supine;Left;5 reps Heel Slides: AAROM;Left;Supine;20 reps Hip ABduction/ADduction: AAROM;Supine;Left;10 reps    General Comments        Pertinent Vitals/Pain Pain Assessment: 0-10 Pain Score: 5  Pain Location: L Lateral area of the hip Pain Descriptors / Indicators: Burning;Sore Pain Intervention(s): Limited activity within patient's tolerance;Monitored during session;Premedicated before session    Home Living                      Prior Function            PT Goals (current goals can now be found in the care plan section) Acute Rehab PT Goals Patient Stated Goal: to get around better with less pain PT Goal Formulation: With patient Time For Goal Achievement: 06/08/20 Potential to Achieve Goals: Good Progress towards PT goals: Progressing toward goals    Frequency    7X/week      PT Plan Current plan remains appropriate    Co-evaluation  AM-PAC PT "6 Clicks" Mobility   Outcome Measure  Help needed turning from your back to your side while in a flat bed without using bedrails?: A Little Help needed moving from lying on your back to sitting on the side of a flat bed without using bedrails?: A Little Help needed moving to and from a bed to a chair (including a wheelchair)?: A Little Help needed standing up from a chair using your arms (e.g., wheelchair or bedside chair)?: A Little Help needed to walk in hospital room?: A  Little Help needed climbing 3-5 steps with a railing? : A Little 6 Click Score: 18    End of Session Equipment Utilized During Treatment: Gait belt Activity Tolerance: Patient tolerated treatment well Patient left: in chair;with call bell/phone within reach;with chair alarm set Nurse Communication: Mobility status PT Visit Diagnosis: Other abnormalities of gait and mobility (R26.89)     Time: 0350-0938 PT Time Calculation (min) (ACUTE ONLY): 23 min  Charges:  $Gait Training: 8-22 mins $Therapeutic Exercise: 23-37 mins                     [place] Pager [phone] Office [phone]    Cortni Tays 05/26/2020, 3:39 PM

## 2020-05-27 NOTE — Anesthesia Postprocedure Evaluation (Signed)
Anesthesia Post Note  Patient: Robyn Johnston  Procedure(s) Performed: TOTAL HIP ARTHROPLASTY ANTERIOR APPROACH (Left Hip)     Patient location during evaluation: Nursing Unit Anesthesia Type: Spinal Level of consciousness: oriented and awake and alert Pain management: pain level controlled Vital Signs Assessment: post-procedure vital signs reviewed and stable Respiratory status: spontaneous breathing and respiratory function stable Cardiovascular status: blood pressure returned to baseline and stable Postop Assessment: no headache, no backache, no apparent nausea or vomiting and patient able to bend at knees Anesthetic complications: no   No complications documented.  Last Vitals:  Vitals:   05/26/20 0911 05/26/20 1442  BP: 132/63 (!) 141/63  Pulse: 90 84  Resp: 18 18  Temp: 36.9 C 36.7 C  SpO2: 98% 95%    Last Pain:  Vitals:   05/26/20 1122  TempSrc:   PainSc: 5                  Trevor Iha

## 2020-12-16 NOTE — Progress Notes (Signed)
DUE TO COVID-19 ONLY ONE VISITOR IS ALLOWED TO COME WITH YOU AND STAY IN THE WAITING ROOM ONLY DURING PRE OP AND PROCEDURE DAY OF SURGERY. THE 1 VISITOR  MAY VISIT WITH YOU AFTER SURGERY IN YOUR PRIVATE ROOM DURING VISITING HOURS ONLY!  YOU NEED TO HAVE A COVID 19 TEST ON___1/15/2022 ____ @_______ , THIS TEST MUST BE DONE BEFORE SURGERY,  COVID TESTING SITE [address] [place] , IT IS ON THE RIGHT GOING OUT [address] APPROXIMATELY  2 MINUTES PAST [employer] ON THE RIGHT. ONCE YOUR COVID TEST IS COMPLETED,  PLEASE BEGIN THE QUARANTINE INSTRUCTIONS AS OUTLINED IN YOUR HANDOUT.                Robyn Johnston  12/16/2020   Your procedure is scheduled on: 12/28/2020    Report to [facility] Main  Entrance   Report to admitting at      1000 AM     Call this number if you have problems the morning of surgery [phone]    REMEMBER: NO  SOLID FOOD CANDY OR GUM AFTER MIDNIGHT. CLEAR LIQUIDS UNTIL  0930am         . NOTHING BY MOUTH EXCEPT CLEAR LIQUIDS UNTIL    . PLEASE FINISH ENSURE DRINK PER SURGEON ORDER  WHICH NEEDS TO BE COMPLETED AT     0930am  .      CLEAR LIQUID DIET   Foods Allowed                                                                    Coffee and tea, regular and decaf                            Fruit ices (not with fruit pulp)                                      Iced Popsicles                                    Carbonated beverages, regular and diet                                    Cranberry, grape and apple juices Sports drinks like Gatorade Lightly seasoned clear broth or consume(fat free) Sugar, honey syrup ___________________________________________________________________      BRUSH YOUR TEETH MORNING OF SURGERY AND RINSE YOUR MOUTH OUT, NO CHEWING GUM CANDY OR MINTS.     Take these medicines the morning of surgery with A SIP OF WATER:   DO NOT TAKE ANY DIABETIC MEDICATIONS DAY OF YOUR SURGERY                                You may not have any metal on your body including hair pins and              piercings  Do  not wear jewelry, make-up, lotions, powders or perfumes, deodorant             Do not wear nail polish on your fingernails.  Do not shave  48 hours prior to surgery.              Men may shave face and neck.   Do not bring valuables to the hospital. [facility] IS NOT             RESPONSIBLE   FOR VALUABLES.  Contacts, dentures or bridgework may not be worn into surgery.  Leave suitcase in the car. After surgery it may be brought to your room.     Patients discharged the day of surgery will not be allowed to drive home. IF YOU ARE HAVING SURGERY AND GOING HOME THE SAME DAY, YOU MUST HAVE AN ADULT TO DRIVE YOU HOME AND BE WITH YOU FOR 24 HOURS. YOU MAY GO HOME BY TAXI OR UBER OR ORTHERWISE, BUT AN ADULT MUST ACCOMPANY YOU HOME AND STAY WITH YOU FOR 24 HOURS.  Name and phone number of your driver:  Special Instructions: N/A              Please read over the following fact sheets you were given: _____________________________________________________________________  [facility] - Preparing for Surgery Before surgery, you can play an important role.  Because skin is not sterile, your skin needs to be as free of germs as possible.  You can reduce the number of germs on your skin by washing with CHG (chlorahexidine gluconate) soap before surgery.  CHG is an antiseptic cleaner which kills germs and bonds with the skin to continue killing germs even after washing. Please DO NOT use if you have an allergy to CHG or antibacterial soaps.  If your skin becomes reddened/irritated stop using the CHG and inform your nurse when you arrive at Short Stay. Do not shave (including legs and underarms) for at least 48 hours prior to the first CHG shower.  You may shave your face/neck. Please follow these instructions carefully:  1.  Shower with CHG Soap the night before surgery and the  morning of Surgery.  2.  If  you choose to wash your hair, wash your hair first as usual with your  normal  shampoo.  3.  After you shampoo, rinse your hair and body thoroughly to remove the  shampoo.                           4.  Use CHG as you would any other liquid soap.  You can apply chg directly  to the skin and wash                       Gently with a scrungie or clean washcloth.  5.  Apply the CHG Soap to your body ONLY FROM THE NECK DOWN.   Do not use on face/ open                           Wound or open sores. Avoid contact with eyes, ears mouth and genitals (private parts).                       Wash face,  Genitals (private parts) with your normal soap.             6.  Wash thoroughly,  paying special attention to the area where your surgery  will be performed.  7.  Thoroughly rinse your body with warm water from the neck down.  8.  DO NOT shower/wash with your normal soap after using and rinsing off  the CHG Soap.                9.  Pat yourself dry with a clean towel.            10.  Wear clean pajamas.            11.  Place clean sheets on your bed the night of your first shower and do not  sleep with pets. Day of Surgery : Do not apply any lotions/deodorants the morning of surgery.  Please wear clean clothes to the hospital/surgery center.  FAILURE TO FOLLOW THESE INSTRUCTIONS MAY RESULT IN THE CANCELLATION OF YOUR SURGERY PATIENT SIGNATURE_________________________________  NURSE SIGNATURE__________________________________  ________________________________________________________________________

## 2020-12-19 ENCOUNTER — Other Ambulatory Visit: Payer: Self-pay

## 2020-12-19 ENCOUNTER — Encounter (HOSPITAL_COMMUNITY)
Admission: RE | Admit: 2020-12-19 | Discharge: 2020-12-19 | Disposition: A | Discharge status: Home / Self Care | Payer: Medicare HMO | Source: Ambulatory Visit | Attending: Orthopedic Surgery | Admitting: Orthopedic Surgery

## 2020-12-19 ENCOUNTER — Encounter (HOSPITAL_COMMUNITY): Payer: Self-pay

## 2020-12-19 DIAGNOSIS — Z01812 Encounter for preprocedural laboratory examination: Secondary | ICD-10-CM | POA: Diagnosis not present

## 2020-12-19 LAB — HEMOGLOBIN A1C
Hgb A1c MFr Bld: 5.5 % (ref 4.8–5.6)
Mean Plasma Glucose: 111.15 mg/dL

## 2020-12-19 LAB — PROTIME-INR
INR: 1 (ref 0.8–1.2)
Prothrombin Time: 12.6 seconds (ref 11.4–15.2)

## 2020-12-19 LAB — CBC
HCT: 34.8 % — ABNORMAL LOW (ref 36.0–46.0)
Hemoglobin: 11.4 g/dL — ABNORMAL LOW (ref 12.0–15.0)
MCH: 32.2 pg (ref 26.0–34.0)
MCHC: 32.8 g/dL (ref 30.0–36.0)
MCV: 98.3 fL (ref 80.0–100.0)
Platelets: 215 10*3/uL (ref 150–400)
RBC: 3.54 MIL/uL — ABNORMAL LOW (ref 3.87–5.11)
RDW: 12.7 % (ref 11.5–15.5)
WBC: 4.9 10*3/uL (ref 4.0–10.5)
nRBC: 0 % (ref 0.0–0.2)

## 2020-12-19 LAB — COMPREHENSIVE METABOLIC PANEL
ALT: 14 U/L (ref 0–44)
AST: 17 U/L (ref 15–41)
Albumin: 4 g/dL (ref 3.5–5.0)
Alkaline Phosphatase: 59 U/L (ref 38–126)
Anion gap: 9 (ref 5–15)
BUN: 12 mg/dL (ref 8–23)
CO2: 31 mmol/L (ref 22–32)
Calcium: 9.6 mg/dL (ref 8.9–10.3)
Chloride: 99 mmol/L (ref 98–111)
Creatinine, Ser: 0.99 mg/dL (ref 0.44–1.00)
GFR, Estimated: >60 mL/min (ref >60)
Glucose, Bld: 114 mg/dL — ABNORMAL HIGH (ref 70–99)
Potassium: 4.3 mmol/L (ref 3.5–5.1)
Sodium: 139 mmol/L (ref 135–145)
Total Bilirubin: 0.4 mg/dL (ref 0.3–1.2)
Total Protein: 7.2 g/dL (ref 6.5–8.1)

## 2020-12-19 LAB — APTT: aPTT: 33 seconds (ref 24–36)

## 2020-12-19 LAB — SURGICAL PCR SCREEN
MRSA, PCR: NEGATIVE
Staphylococcus aureus: NEGATIVE

## 2020-12-19 NOTE — Progress Notes (Addendum)
Anesthesia Review:  PCP: DR [facility]  Tresa Endo to fax clearance - clearance on chart dated 11/29/2020  Cardiologist : Chest x-ray : EKG :05/20/2020  Echo : Stress test: Cardiac Cath :  Activity level: can do a a flight of stairs without difficulty  Sleep Study/ CPAP : no  Fasting Blood Sugar :      / Checks Blood Sugar -- times a day:   Blood Thinner/ Instructions /Last Dose: ASA / Instructions/ Last Dose :  A1C- -12/19/20-5.5

## 2020-12-24 ENCOUNTER — Other Ambulatory Visit (HOSPITAL_COMMUNITY): Payer: Medicare HMO

## 2020-12-28 LAB — TYPE AND SCREEN
ABO/RH(D): O POS
Antibody Screen: NEGATIVE

## 2021-01-03 NOTE — Patient Instructions (Addendum)
DUE TO COVID-19 ONLY ONE VISITOR IS ALLOWED TO COME WITH YOU AND STAY IN THE WAITING ROOM ONLY DURING PRE OP AND PROCEDURE.   IF YOU WILL BE ADMITTED INTO THE HOSPITAL YOU ARE ALLOWED ONE SUPPORT PERSON DURING VISITATION HOURS ONLY (10AM -8PM)   . The support person may change daily. . The support person must pass our screening, gel in and out, and wear a mask at all times, including in the patient's room. . Patients must also wear a mask when staff or their support person are in the room.   COVID SWAB TESTING MUST BE COMPLETED ON:  Thursday, 01-12-21 @ 10:00   [address] [place], [place] [id number]  (Must self quarantine after testing. Follow instructions on handout.)        Your procedure is scheduled on:  Monday, 01-16-21   Report to [facility] Main  Entrance    Report to admitting at 10:00 AM   Call this number if you have problems the morning of surgery [phone]   Do not eat food :After Midnight.   May have liquids until 9:30 AM day of surgery  CLEAR LIQUID DIET  Foods Allowed                                                                     Foods Excluded  Water, Black Coffee and tea, regular and decaf              liquids that you cannot  Plain Jell-O in any flavor  (No red)                                     see through such as: Fruit ices (not with fruit pulp)                                      milk, soups, orange juice              Iced Popsicles (No red)                                      All solid food                                   Apple juices Sports drinks like Gatorade (No red) Lightly seasoned clear broth or consume(fat free) Sugar, honey syrup   Complete presurgical G2 at 9:30 AM the day of surgery  Oral Hygiene is also important to reduce your risk of infection.                                    Remember - BRUSH YOUR TEETH THE MORNING OF SURGERY WITH YOUR REGULAR TOOTHPASTE   Do NOT smoke after Midnight   Take these medicines the  morning of surgery with A SIP OF WATER:  Atorvastatin, Nexium, Gabapentin    How to Manage Your Diabetes Before and After Surgery  Why is it important to control my blood sugar before and after surgery? . Improving blood sugar levels before and after surgery helps healing and can limit problems. . A way of improving blood sugar control is eating a healthy diet by: o  Eating less sugar and carbohydrates o  Increasing activity/exercise o  Talking with your doctor about reaching your blood sugar goals . High blood sugars (greater than 180 mg/dL) can raise your risk of infections and slow your recovery, so you will need to focus on controlling your diabetes during the weeks before surgery. . Make sure that the doctor who takes care of your diabetes knows about your planned surgery including the date and location.  How do I manage my blood sugar before surgery? . Check your blood sugar at least 4 times a day, starting 2 days before surgery, to make sure that the level is not too high or low. o Check your blood sugar the morning of your surgery when you wake up and every 2 hours until you get to the Short Stay unit. . If your blood sugar is less than 70 mg/dL, you will need to treat for low blood sugar: o Do not take insulin. o Treat a low blood sugar (less than 70 mg/dL) with  cup of clear juice (cranberry or apple), 4 glucose tablets, OR glucose gel. o Recheck blood sugar in 15 minutes after treatment (to make sure it is greater than 70 mg/dL). If your blood sugar is not greater than 70 mg/dL on recheck, call [phone] for further instructions. . Report your blood sugar to the short stay nurse when you get to Short Stay.  . If you are admitted to the hospital after surgery: o Your blood sugar will be checked by the staff and you will probably be given insulin after surgery (instead of oral diabetes medicines) to make sure you have good blood sugar levels. o The goal for blood sugar control  after surgery is 80-180 mg/dL.   WHAT DO I DO ABOUT MY DIABETES MEDICATION?  Marland Kitchen Do not take oral diabetes medicines (pills) the morning of surgery.  . THE DAY BEFORE SURGERY:  Take Metformin as prescribed.       . THE MORNING OF SURGERY:  Do not take Metformin.   Reviewed and Endorsed by [facility] Patient Education Committee, August 2015                                You may not have any metal on your body including hair pins, jewelry, and body piercings             Do not wear make-up, lotions, powders, perfumes/cologne, or deodorant             Do not wear nail polish.  Do not shave  48 hours prior to surgery.          Do not bring valuables to the hospital. [facility] IS NOT RESPONSIBLE   FOR VALUABLES.   Contacts, dentures or bridgework may not be worn into surgery.   Bring small overnight bag day of surgery.                 Please read over the following fact sheets you were given: IF YOU HAVE QUESTIONS ABOUT YOUR PRE OP INSTRUCTIONS PLEASE CALL [phone] [facility] - Preparing for Surgery Before surgery, you can play an important role.  Because skin is not sterile, your skin needs to be as free of germs as possible.  You can reduce the number of germs on your skin by washing with CHG (chlorahexidine gluconate) soap before surgery.  CHG is an antiseptic cleaner which kills germs and bonds with the skin to continue killing germs even after washing. Please DO NOT use if you have an allergy to CHG or antibacterial soaps.  If your skin becomes reddened/irritated stop using the CHG and inform your nurse when you arrive at Short Stay. Do not shave (including legs and underarms) for at least 48 hours prior to the first CHG shower.  You may shave your face/neck.  Please follow these instructions carefully:  1.  Shower with CHG Soap the night before surgery and the  morning of surgery.  2.  If you choose to wash your hair, wash your hair first as usual with your normal   shampoo.  3.  After you shampoo, rinse your hair and body thoroughly to remove the shampoo.                             4.  Use CHG as you would any other liquid soap.  You can apply chg directly to the skin and wash.  Gently with a scrungie or clean washcloth.  5.  Apply the CHG Soap to your body ONLY FROM THE NECK DOWN.   Do   not use on face/ open                           Wound or open sores. Avoid contact with eyes, ears mouth and   genitals (private parts).                       Wash face,  Genitals (private parts) with your normal soap.             6.  Wash thoroughly, paying special attention to the area where your    surgery  will be performed.  7.  Thoroughly rinse your body with warm water from the neck down.  8.  DO NOT shower/wash with your normal soap after using and rinsing off the CHG Soap.                9.  Pat yourself dry with a clean towel.            10.  Wear clean pajamas.            11.  Place clean sheets on your bed the night of your first shower and do not  sleep with pets. Day of Surgery : Do not apply any lotions/deodorants the morning of surgery.  Please wear clean clothes to the hospital/surgery center.  FAILURE TO FOLLOW THESE INSTRUCTIONS MAY RESULT IN THE CANCELLATION OF YOUR SURGERY  PATIENT SIGNATURE_________________________________  NURSE SIGNATURE__________________________________  ________________________________________________________________________   Robyn Johnston  An incentive spirometer is a tool that can help keep your lungs clear and active. This tool measures how well you are filling your lungs with each breath. Taking long deep breaths may help reverse or decrease the chance of developing breathing (pulmonary) problems (especially infection) following:  A long period of time when you are unable to move or be active. BEFORE THE PROCEDURE  If the spirometer includes an indicator to show your best effort, your nurse or respiratory  therapist will set it to a desired goal.  If possible, sit up straight or lean slightly forward. Try not to slouch.  Hold the incentive spirometer in an upright position. INSTRUCTIONS FOR USE  1. Sit on the edge of your bed if possible, or sit up as far as you can in bed or on a chair. 2. Hold the incentive spirometer in an upright position. 3. Breathe out normally. 4. Place the mouthpiece in your mouth and seal your lips tightly around it. 5. Breathe in slowly and as deeply as possible, raising the piston or the ball toward the top of the column. 6. Hold your breath for 3-5 seconds or for as long as possible. Allow the piston or ball to fall to the bottom of the column. 7. Remove the mouthpiece from your mouth and breathe out normally. 8. Rest for a few seconds and repeat Steps 1 through 7 at least 10 times every 1-2 hours when you are awake. Take your time and take a few normal breaths between deep breaths. 9. The spirometer may include an indicator to show your best effort. Use the indicator as a goal to work toward during each repetition. 10. After each set of 10 deep breaths, practice coughing to be sure your lungs are clear. If you have an incision (the cut made at the time of surgery), support your incision when coughing by placing a pillow or rolled up towels firmly against it. Once you are able to get out of bed, walk around indoors and cough well. You may stop using the incentive spirometer when instructed by your caregiver.  RISKS AND COMPLICATIONS  Take your time so you do not get dizzy or light-headed.  If you are in pain, you may need to take or ask for pain medication before doing incentive spirometry. It is harder to take a deep breath if you are having pain. AFTER USE  Rest and breathe slowly and easily.  It can be helpful to keep track of a log of your progress. Your caregiver can provide you with a simple table to help with this. If you are using the spirometer at home,  follow these instructions: SEEK MEDICAL CARE IF:   You are having difficultly using the spirometer.  You have trouble using the spirometer as often as instructed.  Your pain medication is not giving enough relief while using the spirometer.  You develop fever of 100.5 F (38.1 C) or higher. SEEK IMMEDIATE MEDICAL CARE IF:   You cough up bloody sputum that had not been present before.  You develop fever of 102 F (38.9 C) or greater.  You develop worsening pain at or near the incision site. MAKE SURE YOU:   Understand these instructions.  Will watch your condition.  Will get help right away if you are not doing well or get worse. Document Released: 04/08/2007 Document Revised: 02/18/2012 Document Reviewed: 06/09/2007 Exit[employer] Patient Information 2014 [employer], [place].   ________________________________________________________________________  WHAT IS A BLOOD TRANSFUSION? Blood Transfusion Information  A transfusion is the replacement of blood or some of its parts. Blood is made up of multiple cells which provide different functions.  Red blood cells carry oxygen and are used for blood loss replacement.  White blood cells fight against infection.  Platelets control bleeding.  Plasma helps clot blood.  Other blood products are available for specialized needs, such as hemophilia or other clotting  disorders. BEFORE THE TRANSFUSION  Who gives blood for transfusions?   Healthy volunteers who are fully evaluated to make sure their blood is safe. This is blood bank blood. Transfusion therapy is the safest it has ever been in the practice of medicine. Before blood is taken from a donor, a complete history is taken to make sure that person has no history of diseases nor engages in risky social behavior (examples are intravenous drug use or sexual activity with multiple partners). The donor's travel history is screened to minimize risk of transmitting infections, such as malaria.  The donated blood is tested for signs of infectious diseases, such as HIV and hepatitis. The blood is then tested to be sure it is compatible with you in order to minimize the chance of a transfusion reaction. If you or a relative donates blood, this is often done in anticipation of surgery and is not appropriate for emergency situations. It takes many days to process the donated blood. RISKS AND COMPLICATIONS Although transfusion therapy is very safe and saves many lives, the main dangers of transfusion include:   Getting an infectious disease.  Developing a transfusion reaction. This is an allergic reaction to something in the blood you were given. Every precaution is taken to prevent this. The decision to have a blood transfusion has been considered carefully by your caregiver before blood is given. Blood is not given unless the benefits outweigh the risks. AFTER THE TRANSFUSION  Right after receiving a blood transfusion, you will usually feel much better and more energetic. This is especially true if your red blood cells have gotten low (anemic). The transfusion raises the level of the red blood cells which carry oxygen, and this usually causes an energy increase.  The nurse administering the transfusion will monitor you carefully for complications. HOME CARE INSTRUCTIONS  No special instructions are needed after a transfusion. You may find your energy is better. Speak with your caregiver about any limitations on activity for underlying diseases you may have. SEEK MEDICAL CARE IF:   Your condition is not improving after your transfusion.  You develop redness or irritation at the intravenous (IV) site. SEEK IMMEDIATE MEDICAL CARE IF:  Any of the following symptoms occur over the next 12 hours:  Shaking chills.  You have a temperature by mouth above 102 F (38.9 C), not controlled by medicine.  Chest, back, or muscle pain.  People around you feel you are not acting correctly or are  confused.  Shortness of breath or difficulty breathing.  Dizziness and fainting.  You get a rash or develop hives.  You have a decrease in urine output.  Your urine turns a dark color or changes to pink, red, or brown. Any of the following symptoms occur over the next 10 days:  You have a temperature by mouth above 102 F (38.9 C), not controlled by medicine.  Shortness of breath.  Weakness after normal activity.  The white part of the eye turns yellow (jaundice).  You have a decrease in the amount of urine or are urinating less often.  Your urine turns a dark color or changes to pink, red, or brown. Document Released: 11/23/2000 Document Revised: 02/18/2012 Document Reviewed: 07/12/2008 [facility] Patient Information 2014 [place], [place].  _______________________________________________________________________

## 2021-01-03 NOTE — Progress Notes (Addendum)
COVID Vaccine Completed:  x3 Date COVID Vaccine completed:  03-18-20 04-11-20 10-28-20 COVID vaccine manufacturer: Pfizer    Moderna   [employer]   PCP - Sharlotte Alamo, MD Cardiologist -   Medical Clearance on chart from Dr. Mordecai Maes dated 11-29-20  Chest x-ray - N/A EKG - 05-20-20 in Epic Stress Test -  ECHO -  Cardiac Cath -  Pacemaker/ICD device last checked:  Sleep Study - N/A CPAP -   Fasting Blood Sugar - 87 to 120 Checks Blood Sugar - 1 time a day  Blood Thinner Instructions: Aspirin Instructions: Last Dose:  Anesthesia review:   Patient denies shortness of breath, fever, cough and chest pain at PAT appointment.  Pt cannot climb a flight of stairs due to hip pain, but was able to prior.  Able to perform housework as tolerated due to hip pain.   Patient verbalized understanding of instructions that were given to them at the PAT appointment. Patient was also instructed that they will need to review over the PAT instructions again at home before surgery.

## 2021-01-06 ENCOUNTER — Other Ambulatory Visit: Payer: Self-pay

## 2021-01-06 ENCOUNTER — Encounter (HOSPITAL_COMMUNITY): Payer: Self-pay

## 2021-01-06 ENCOUNTER — Encounter (HOSPITAL_COMMUNITY)
Admission: RE | Admit: 2021-01-06 | Discharge: 2021-01-06 | Disposition: A | Discharge status: Home / Self Care | Payer: Medicare HMO | Source: Ambulatory Visit | Attending: Orthopedic Surgery | Admitting: Orthopedic Surgery

## 2021-01-06 DIAGNOSIS — Z01812 Encounter for preprocedural laboratory examination: Secondary | ICD-10-CM | POA: Diagnosis present

## 2021-01-06 LAB — BASIC METABOLIC PANEL
Anion gap: 7 (ref 5–15)
BUN: 15 mg/dL (ref 8–23)
CO2: 29 mmol/L (ref 22–32)
Calcium: 9.9 mg/dL (ref 8.9–10.3)
Chloride: 103 mmol/L (ref 98–111)
Creatinine, Ser: 0.88 mg/dL (ref 0.44–1.00)
GFR, Estimated: >60 mL/min (ref >60)
Glucose, Bld: 104 mg/dL — ABNORMAL HIGH (ref 70–99)
Potassium: 4.6 mmol/L (ref 3.5–5.1)
Sodium: 139 mmol/L (ref 135–145)

## 2021-01-06 LAB — SURGICAL PCR SCREEN
MRSA, PCR: NEGATIVE
Staphylococcus aureus: NEGATIVE

## 2021-01-06 LAB — CBC
HCT: 34.7 % — ABNORMAL LOW (ref 36.0–46.0)
Hemoglobin: 11.5 g/dL — ABNORMAL LOW (ref 12.0–15.0)
MCH: 32 pg (ref 26.0–34.0)
MCHC: 33.1 g/dL (ref 30.0–36.0)
MCV: 96.7 fL (ref 80.0–100.0)
Platelets: 218 10*3/uL (ref 150–400)
RBC: 3.59 MIL/uL — ABNORMAL LOW (ref 3.87–5.11)
RDW: 12.6 % (ref 11.5–15.5)
WBC: 5.5 10*3/uL (ref 4.0–10.5)
nRBC: 0 % (ref 0.0–0.2)

## 2021-01-06 LAB — GLUCOSE, CAPILLARY: Glucose-Capillary: 99 mg/dL (ref 70–99)

## 2021-01-12 ENCOUNTER — Other Ambulatory Visit (HOSPITAL_COMMUNITY)
Admission: RE | Admit: 2021-01-12 | Discharge: 2021-01-12 | Disposition: A | Discharge status: Home / Self Care | Payer: Medicare HMO | Source: Ambulatory Visit | Attending: Orthopedic Surgery | Admitting: Orthopedic Surgery

## 2021-01-12 DIAGNOSIS — Z20822 Contact with and (suspected) exposure to covid-19: Secondary | ICD-10-CM | POA: Insufficient documentation

## 2021-01-12 DIAGNOSIS — Z01812 Encounter for preprocedural laboratory examination: Secondary | ICD-10-CM | POA: Insufficient documentation

## 2021-01-12 LAB — SARS CORONAVIRUS 2 (TAT 6-24 HRS): SARS Coronavirus 2: NEGATIVE

## 2021-01-12 NOTE — H&P (Signed)
TOTAL HIP ADMISSION H&P  Patient is admitted for right total hip arthroplasty.  Subjective:  Chief Complaint: Right hip pain  HPI: Robyn Johnston, 69 y.o. female, has a history of pain and functional disability in the right hip due to arthritis and patient has failed non-surgical conservative treatments for greater than 12 weeks to include use of assistive devices and activity modification. Onset of symptoms was gradual, starting several years ago with gradually worsening course since that time. The patient noted no past surgery on the right hip. Patient currently rates pain in the right hip at 6 out of 10 with activity. Patient has worsening of pain with activity and weight bearing, trendelenberg gait, pain that interfers with activities of daily living and crepitus. Patient has evidence of advanced bone-on-bone arthritis with marginal osteophyte and subchondral cystic formation by imaging studies. This condition presents safety issues increasing the risk of falls.  There is no current active infection.  Patient Active Problem List   Diagnosis Date Noted  . OA (osteoarthritis) of hip 05/25/2020  . Primary osteoarthritis of left hip 05/25/2020    Past Medical History:  Diagnosis Date  . Arthritis   . Complication of anesthesia   . Diabetes mellitus without complication (HCC)    type 2   . GERD (gastroesophageal reflux disease)   . Hypertension   . PONV (postoperative nausea and vomiting)     Past Surgical History:  Procedure Laterality Date  . BACK SURGERY     spinal stenosis  . BLADDER SURGERY    . COLONOSCOPY    . HYSTERECTOMY ABDOMINAL WITH SALPINGECTOMY    . TOTAL HIP ARTHROPLASTY Left 05/25/2020   Procedure: TOTAL HIP ARTHROPLASTY ANTERIOR APPROACH;  Surgeon: Ollen Gross, MD;  Location: [facility];  Service: Orthopedics;  Laterality: Left;     Prior to Admission medications   Medication Sig Start Date End Date Taking? Authorizing Provider  acetaminophen (TYLENOL) 650 MG  CR tablet Take 1,300 mg by mouth every 8 (eight) hours as needed for pain.   Yes [provider]  atorvastatin (LIPITOR) 10 MG tablet Take 10 mg by mouth daily. 05/02/20  Yes [provider]  Biotin 5 MG CAPS Take 5 mg by mouth every other day.   Yes [provider]  Cholecalciferol (DIALYVITE VITAMIN D 5000) 125 MCG (5000 UT) capsule Take 5,000 Units by mouth daily.   Yes [provider]  Cyanocobalamin (B-12) 2500 MCG TABS Take 2,500 mcg by mouth daily.   Yes [provider]  esomeprazole (NEXIUM) 40 MG capsule Take 40 mg by mouth daily. 04/18/20  Yes [provider]  gabapentin (NEURONTIN) 600 MG tablet Take 600 mg by mouth 4 (four) times daily. 04/08/20  Yes [provider]  lisinopril-hydrochlorothiazide (ZESTORETIC) 20-25 MG tablet Take 1 tablet by mouth daily. 05/10/20  Yes [provider]  metFORMIN (GLUCOPHAGE-XR) 500 MG 24 hr tablet Take 500 mg by mouth daily. 04/18/20  Yes [provider]  Multiple Vitamin (MULTIVITAMIN WITH MINERALS) TABS tablet Take 1 tablet by mouth daily.   Yes [provider]  oxymetazoline (AFRIN) 0.05 % nasal spray Place 1 spray into both nostrils 2 (two) times daily.   Yes [provider]  tiZANidine (ZANAFLEX) 4 MG tablet Take 4 mg by mouth 4 (four) times daily.   Yes [provider]  traMADol HCl 100 MG TABS Take 100 mg by mouth 4 (four) times daily.   Yes [provider]    Allergies  Allergen Reactions  .  Penicillins Hives    Tolerated Cephalosporin Date: 05/26/20.      Social History   Socioeconomic History  . Marital status: Married    Spouse name: Not on file  . Number of children: Not on file  . Years of education: Not on file  . Highest education level: Not on file  Occupational History  . Not on file  Tobacco Use  . Smoking status: Former Smoker    Quit date: 2011    Years since quitting: 11.0  . Smokeless tobacco: Never Used   Vaping Use  . Vaping Use: Never used  Substance and Sexual Activity  . Alcohol use: Never  . Drug use: Never  . Sexual activity: Not on file    Comment: Hysterectomy  Other Topics Concern  . Not on file  Social History Narrative  . Not on file   Social Determinants of Health   Financial Resource Strain: Not on file  Food Insecurity: Not on file  Transportation Needs: Not on file  Physical Activity: Not on file  Stress: Not on file  Social Connections: Not on file  Intimate Partner Violence: Not on file    Tobacco Use: Medium Risk  . Smoking Tobacco Use: Former Smoker  . Smokeless Tobacco Use: Never Used   Social History   Substance and Sexual Activity  Alcohol Use Never    No family history on file.  Review of Systems  Constitutional: Negative for chills and fever.  HENT: Negative for congestion, sore throat and tinnitus.   Eyes: Negative for double vision, photophobia and pain.  Respiratory: Negative for cough, shortness of breath and wheezing.   Cardiovascular: Negative for chest pain, palpitations and orthopnea.  Gastrointestinal: Negative for heartburn, nausea and vomiting.  Genitourinary: Negative for dysuria, frequency and urgency.  Musculoskeletal: Positive for joint pain.  Neurological: Negative for dizziness, weakness and headaches.     Objective:  Physical Exam: Well nourished and well developed.  General: Alert and oriented x3, cooperative and pleasant, no acute distress.  Head: normocephalic, atraumatic, neck supple.  Eyes: EOMI.  Respiratory: breath sounds clear in all fields, no wheezing, rales, or rhonchi. Cardiovascular: Regular rate and rhythm, no murmurs, gallops or rubs.  Abdomen: non-tender to palpation and soft, normoactive bowel sounds. Musculoskeletal:  Right Hip Exam:  Pain with passive range of motion.  AROM 100 degrees flexion, 0 degrees internal rotation, 10 degrees external rotation, 20 degrees abduction.  Calves soft and  nontender. Motor function intact in LE. Strength 5/5 LE bilaterally. Neuro: Distal pulses 2+. Sensation to light touch intact in LE.  Imaging Review Plain radiographs demonstrate severe degenerative joint disease of the right hip. The bone quality appears to be adequate for age and reported activity level.  Assessment/Plan:  End stage arthritis, right hip  The patient history, physical examination, clinical judgement of the provider and imaging studies are consistent with end stage degenerative joint disease of the right hip and total hip arthroplasty is deemed medically necessary. The treatment options including medical management, injection therapy, arthroscopy and arthroplasty were discussed at length. The risks and benefits of total hip arthroplasty were presented and reviewed. The risks due to aseptic loosening, infection, stiffness, dislocation/subluxation, thromboembolic complications and other imponderables were discussed. The patient acknowledged the explanation, agreed to proceed with the plan and consent was signed. Patient is being admitted for inpatient treatment for surgery, pain control, PT, OT, prophylactic antibiotics, VTE prophylaxis, progressive ambulation and ADLs and discharge planning.The patient is planning to be discharged home.  Patient's anticipated LOS is less than 2 midnights, meeting these requirements: - Younger than 21 - Lives within 1 hour of care - Has a competent adult at home to recover with post-op recover - NO history of  - Chronic pain requiring opiods  - Diabetes  - Coronary Artery Disease  - Heart failure  - Heart attack  - Stroke  - DVT/VTE  - Cardiac arrhythmia  - Respiratory Failure/COPD  - Renal failure  - Anemia  - Advanced Liver disease  Therapy Plans: HEP Disposition: Home with husband Planned DVT Prophylaxis: Xarelto 10 mg QD (cannot tolerate ASA) DME Needed: None PCP: Sharlotte Alamo, MD (clearance received) TXA:  IV Allergies: PCN (hives) Anesthesia Concerns: Nausea BMI: 32.8 Last HgbA1c: 5.9% Pharmacy: [facility])  Other: Oxycodone, methocarbamol for previous THA (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 - Follow-up visit in 2 weeks with [name] - Begin physical therapy following surgery - Pre-operative lab work as pre-surgical testing - Prescriptions will be provided in hospital at time of discharge  Arther Abbott, PA-C Orthopedic Surgery [facility] [place]

## 2021-01-16 ENCOUNTER — Other Ambulatory Visit: Payer: Self-pay

## 2021-01-16 ENCOUNTER — Observation Stay (HOSPITAL_COMMUNITY): Payer: Medicare HMO

## 2021-01-16 ENCOUNTER — Ambulatory Visit (HOSPITAL_COMMUNITY): Payer: Medicare HMO | Admitting: Physician Assistant

## 2021-01-16 ENCOUNTER — Encounter (HOSPITAL_COMMUNITY): Payer: Self-pay | Admitting: Orthopedic Surgery

## 2021-01-16 ENCOUNTER — Ambulatory Visit (HOSPITAL_COMMUNITY): Payer: Medicare HMO | Admitting: Certified Registered Nurse Anesthetist

## 2021-01-16 ENCOUNTER — Observation Stay (HOSPITAL_COMMUNITY)
Admission: RE | Admit: 2021-01-16 | Discharge: 2021-01-18 | Disposition: A | Discharge status: Home / Self Care | Payer: Medicare HMO | Source: Ambulatory Visit | Attending: Orthopedic Surgery | Admitting: Orthopedic Surgery

## 2021-01-16 ENCOUNTER — Encounter (HOSPITAL_COMMUNITY)
Admission: RE | Disposition: A | Discharge status: Home / Self Care | Payer: Self-pay | Source: Ambulatory Visit | Attending: Orthopedic Surgery

## 2021-01-16 DIAGNOSIS — M1611 Unilateral primary osteoarthritis, right hip: Secondary | ICD-10-CM | POA: Diagnosis present

## 2021-01-16 DIAGNOSIS — I10 Essential (primary) hypertension: Secondary | ICD-10-CM | POA: Insufficient documentation

## 2021-01-16 DIAGNOSIS — Z87891 Personal history of nicotine dependence: Secondary | ICD-10-CM | POA: Diagnosis not present

## 2021-01-16 DIAGNOSIS — E119 Type 2 diabetes mellitus without complications: Secondary | ICD-10-CM | POA: Insufficient documentation

## 2021-01-16 DIAGNOSIS — Z7984 Long term (current) use of oral hypoglycemic drugs: Secondary | ICD-10-CM | POA: Insufficient documentation

## 2021-01-16 DIAGNOSIS — Z79899 Other long term (current) drug therapy: Secondary | ICD-10-CM | POA: Diagnosis not present

## 2021-01-16 DIAGNOSIS — Z419 Encounter for procedure for purposes other than remedying health state, unspecified: Secondary | ICD-10-CM

## 2021-01-16 DIAGNOSIS — Z96649 Presence of unspecified artificial hip joint: Secondary | ICD-10-CM

## 2021-01-16 HISTORY — PX: TOTAL HIP ARTHROPLASTY: SHX124

## 2021-01-16 LAB — GLUCOSE, CAPILLARY
Glucose-Capillary: 86 mg/dL (ref 70–99)
Glucose-Capillary: 94 mg/dL (ref 70–99)

## 2021-01-16 LAB — TYPE AND SCREEN
ABO/RH(D): O POS
Antibody Screen: NEGATIVE

## 2021-01-16 SURGERY — ARTHROPLASTY, HIP, TOTAL, ANTERIOR APPROACH
Anesthesia: Spinal | Site: Hip | Laterality: Right

## 2021-01-16 MED ORDER — LACTATED RINGERS IV SOLN: INTRAVENOUS | Status: DC

## 2021-01-16 MED ORDER — SODIUM CHLORIDE 0.9 % IV SOLN: INTRAVENOUS | Status: DC

## 2021-01-16 MED ORDER — DEXAMETHASONE SODIUM PHOSPHATE 10 MG/ML IJ SOLN
INTRAMUSCULAR | Status: AC
  Filled 2021-01-16: qty 1

## 2021-01-16 MED ORDER — GABAPENTIN 300 MG PO CAPS
600.0000 mg | ORAL_CAPSULE | Freq: Four times a day (QID) | ORAL | Status: DC
  Administered 2021-01-16 – 2021-01-18 (×7): 600 mg via ORAL
  Filled 2021-01-16 (×7): qty 2

## 2021-01-16 MED ORDER — MORPHINE SULFATE (PF) 2 MG/ML IV SOLN
0.5000 mg | INTRAVENOUS | Status: DC | PRN
  Administered 2021-01-16 (×2): 1 mg via INTRAVENOUS
  Filled 2021-01-16 (×2): qty 1

## 2021-01-16 MED ORDER — ONDANSETRON HCL 4 MG/2ML IJ SOLN
INTRAMUSCULAR | Status: AC
  Filled 2021-01-16: qty 2

## 2021-01-16 MED ORDER — POLYETHYLENE GLYCOL 3350 17 G PO PACK: 17.0000 g | PACK | Freq: Every day | ORAL | Status: DC | PRN

## 2021-01-16 MED ORDER — TRANEXAMIC ACID-NACL 1000-0.7 MG/100ML-% IV SOLN
1000.0000 mg | INTRAVENOUS | Status: AC
  Administered 2021-01-16: 1000 mg via INTRAVENOUS
  Filled 2021-01-16: qty 100

## 2021-01-16 MED ORDER — BUPIVACAINE IN DEXTROSE 0.75-8.25 % IT SOLN
INTRATHECAL | Status: DC | PRN
  Administered 2021-01-16: 1.8 mL via INTRATHECAL

## 2021-01-16 MED ORDER — OXYMETAZOLINE HCL 0.05 % NA SOLN: 1.0000 | Freq: Two times a day (BID) | NASAL | Status: DC | PRN

## 2021-01-16 MED ORDER — PROPOFOL 10 MG/ML IV BOLUS
INTRAVENOUS | Status: AC
  Filled 2021-01-16: qty 20

## 2021-01-16 MED ORDER — METHOCARBAMOL 1000 MG/10ML IJ SOLN
500.0000 mg | Freq: Four times a day (QID) | INTRAVENOUS | Status: DC | PRN
  Filled 2021-01-16: qty 5

## 2021-01-16 MED ORDER — DEXAMETHASONE SODIUM PHOSPHATE 10 MG/ML IJ SOLN: 8.0000 mg | Freq: Once | INTRAMUSCULAR | Status: DC

## 2021-01-16 MED ORDER — HYDROMORPHONE HCL 1 MG/ML IJ SOLN: 0.2500 mg | INTRAMUSCULAR | Status: DC | PRN

## 2021-01-16 MED ORDER — DEXAMETHASONE SODIUM PHOSPHATE 10 MG/ML IJ SOLN
10.0000 mg | Freq: Once | INTRAMUSCULAR | Status: AC
  Administered 2021-01-17: 10 mg via INTRAVENOUS
  Filled 2021-01-16: qty 1

## 2021-01-16 MED ORDER — DOCUSATE SODIUM 100 MG PO CAPS
100.0000 mg | ORAL_CAPSULE | Freq: Two times a day (BID) | ORAL | Status: DC
  Administered 2021-01-16 – 2021-01-18 (×4): 100 mg via ORAL
  Filled 2021-01-16 (×4): qty 1

## 2021-01-16 MED ORDER — OXYCODONE HCL 5 MG PO TABS: 5.0000 mg | ORAL_TABLET | Freq: Once | ORAL | Status: DC | PRN

## 2021-01-16 MED ORDER — POVIDONE-IODINE 10 % EX SWAB
2.0000 "application " | Freq: Once | CUTANEOUS | Status: AC
  Administered 2021-01-16: 2 via TOPICAL

## 2021-01-16 MED ORDER — 0.9 % SODIUM CHLORIDE (POUR BTL) OPTIME
TOPICAL | Status: DC | PRN
  Administered 2021-01-16: 1000 mL

## 2021-01-16 MED ORDER — ONDANSETRON HCL 4 MG/2ML IJ SOLN
INTRAMUSCULAR | Status: DC | PRN
  Administered 2021-01-16: 4 mg via INTRAVENOUS

## 2021-01-16 MED ORDER — CHLORHEXIDINE GLUCONATE 0.12 % MT SOLN: 15.0000 mL | Freq: Once | OROMUCOSAL | Status: AC

## 2021-01-16 MED ORDER — ALBUMIN HUMAN 5 % IV SOLN: INTRAVENOUS | Status: DC | PRN

## 2021-01-16 MED ORDER — FENTANYL CITRATE (PF) 100 MCG/2ML IJ SOLN
INTRAMUSCULAR | Status: DC | PRN
  Administered 2021-01-16: 50 ug via INTRAVENOUS

## 2021-01-16 MED ORDER — TRAMADOL HCL 50 MG PO TABS
100.0000 mg | ORAL_TABLET | Freq: Four times a day (QID) | ORAL | Status: DC | PRN
  Administered 2021-01-16: 100 mg via ORAL
  Filled 2021-01-16: qty 2

## 2021-01-16 MED ORDER — OXYCODONE HCL 5 MG PO TABS
5.0000 mg | ORAL_TABLET | ORAL | Status: DC | PRN
  Administered 2021-01-16 – 2021-01-17 (×3): 10 mg via ORAL
  Filled 2021-01-16 (×3): qty 2

## 2021-01-16 MED ORDER — PHENYLEPHRINE 40 MCG/ML (10ML) SYRINGE FOR IV PUSH (FOR BLOOD PRESSURE SUPPORT)
PREFILLED_SYRINGE | INTRAVENOUS | Status: AC
  Filled 2021-01-16: qty 10

## 2021-01-16 MED ORDER — FENTANYL CITRATE (PF) 100 MCG/2ML IJ SOLN
INTRAMUSCULAR | Status: AC
  Filled 2021-01-16: qty 2

## 2021-01-16 MED ORDER — BUPIVACAINE-EPINEPHRINE (PF) 0.25% -1:200000 IJ SOLN
INTRAMUSCULAR | Status: DC | PRN
  Administered 2021-01-16: 30 mL

## 2021-01-16 MED ORDER — ATORVASTATIN CALCIUM 10 MG PO TABS
10.0000 mg | ORAL_TABLET | Freq: Every day | ORAL | Status: DC
  Administered 2021-01-17 – 2021-01-18 (×2): 10 mg via ORAL
  Filled 2021-01-16 (×2): qty 1

## 2021-01-16 MED ORDER — ONDANSETRON HCL 4 MG PO TABS: 4.0000 mg | ORAL_TABLET | Freq: Four times a day (QID) | ORAL | Status: DC | PRN

## 2021-01-16 MED ORDER — METOCLOPRAMIDE HCL 5 MG/ML IJ SOLN: 5.0000 mg | Freq: Three times a day (TID) | INTRAMUSCULAR | Status: DC | PRN

## 2021-01-16 MED ORDER — OXYCODONE HCL 5 MG/5ML PO SOLN: 5.0000 mg | Freq: Once | ORAL | Status: DC | PRN

## 2021-01-16 MED ORDER — PHENOL 1.4 % MT LIQD: 1.0000 | OROMUCOSAL | Status: DC | PRN

## 2021-01-16 MED ORDER — HYDROCHLOROTHIAZIDE 25 MG PO TABS
25.0000 mg | ORAL_TABLET | Freq: Every day | ORAL | Status: DC
  Administered 2021-01-17 – 2021-01-18 (×2): 25 mg via ORAL
  Filled 2021-01-16 (×2): qty 1

## 2021-01-16 MED ORDER — PANTOPRAZOLE SODIUM 40 MG PO TBEC
80.0000 mg | DELAYED_RELEASE_TABLET | Freq: Every day | ORAL | Status: DC
  Administered 2021-01-17 – 2021-01-18 (×2): 80 mg via ORAL
  Filled 2021-01-16 (×2): qty 2

## 2021-01-16 MED ORDER — ACETAMINOPHEN 10 MG/ML IV SOLN
1000.0000 mg | Freq: Four times a day (QID) | INTRAVENOUS | Status: DC
  Administered 2021-01-16: 1000 mg via INTRAVENOUS
  Filled 2021-01-16: qty 100

## 2021-01-16 MED ORDER — ACETAMINOPHEN 325 MG PO TABS
325.0000 mg | ORAL_TABLET | Freq: Four times a day (QID) | ORAL | Status: DC | PRN
  Administered 2021-01-17: 650 mg via ORAL
  Filled 2021-01-16: qty 2

## 2021-01-16 MED ORDER — MIDAZOLAM HCL 5 MG/5ML IJ SOLN
INTRAMUSCULAR | Status: DC | PRN
  Administered 2021-01-16: 2 mg via INTRAVENOUS

## 2021-01-16 MED ORDER — MIDAZOLAM HCL 2 MG/2ML IJ SOLN
INTRAMUSCULAR | Status: AC
  Filled 2021-01-16: qty 2

## 2021-01-16 MED ORDER — PROPOFOL 500 MG/50ML IV EMUL
INTRAVENOUS | Status: DC | PRN
  Administered 2021-01-16: 25 ug/kg/min via INTRAVENOUS

## 2021-01-16 MED ORDER — ONDANSETRON HCL 4 MG/2ML IJ SOLN: 4.0000 mg | Freq: Four times a day (QID) | INTRAMUSCULAR | Status: DC | PRN

## 2021-01-16 MED ORDER — BISACODYL 10 MG RE SUPP
10.0000 mg | Freq: Every day | RECTAL | Status: DC | PRN
Start: 2021-01-16 — End: 2021-01-18

## 2021-01-16 MED ORDER — DIPHENHYDRAMINE HCL 25 MG PO CAPS
25.0000 mg | ORAL_CAPSULE | ORAL | Status: DC | PRN
  Administered 2021-01-16: 25 mg via ORAL
  Filled 2021-01-16: qty 1

## 2021-01-16 MED ORDER — LIDOCAINE HCL (PF) 2 % IJ SOLN
INTRAMUSCULAR | Status: AC
  Filled 2021-01-16: qty 5

## 2021-01-16 MED ORDER — RIVAROXABAN 10 MG PO TABS
10.0000 mg | ORAL_TABLET | Freq: Every day | ORAL | Status: DC
  Administered 2021-01-17 – 2021-01-18 (×2): 10 mg via ORAL
  Filled 2021-01-16 (×2): qty 1

## 2021-01-16 MED ORDER — PROPOFOL 500 MG/50ML IV EMUL
INTRAVENOUS | Status: DC | PRN
  Administered 2021-01-16: 30 mg via INTRAVENOUS
  Administered 2021-01-16: 20 mg via INTRAVENOUS
  Administered 2021-01-16: 40 mg via INTRAVENOUS
  Administered 2021-01-16: 20 mg via INTRAVENOUS

## 2021-01-16 MED ORDER — MENTHOL 3 MG MT LOZG: 1.0000 | LOZENGE | OROMUCOSAL | Status: DC | PRN

## 2021-01-16 MED ORDER — BUPIVACAINE-EPINEPHRINE (PF) 0.25% -1:200000 IJ SOLN
INTRAMUSCULAR | Status: AC
  Filled 2021-01-16: qty 30

## 2021-01-16 MED ORDER — ORAL CARE MOUTH RINSE
15.0000 mL | Freq: Once | OROMUCOSAL | Status: AC
  Administered 2021-01-16: 15 mL via OROMUCOSAL

## 2021-01-16 MED ORDER — ALBUMIN HUMAN 5 % IV SOLN
INTRAVENOUS | Status: AC
  Filled 2021-01-16: qty 250

## 2021-01-16 MED ORDER — PHENYLEPHRINE 40 MCG/ML (10ML) SYRINGE FOR IV PUSH (FOR BLOOD PRESSURE SUPPORT)
PREFILLED_SYRINGE | INTRAVENOUS | Status: DC | PRN
  Administered 2021-01-16: 80 ug via INTRAVENOUS

## 2021-01-16 MED ORDER — CEFAZOLIN SODIUM-DEXTROSE 2-4 GM/100ML-% IV SOLN
2.0000 g | Freq: Four times a day (QID) | INTRAVENOUS | Status: AC
  Administered 2021-01-16 – 2021-01-17 (×2): 2 g via INTRAVENOUS
  Filled 2021-01-16 (×2): qty 100

## 2021-01-16 MED ORDER — PROPOFOL 500 MG/50ML IV EMUL
INTRAVENOUS | Status: AC
  Filled 2021-01-16: qty 50

## 2021-01-16 MED ORDER — WATER FOR IRRIGATION, STERILE IR SOLN
Status: DC | PRN
  Administered 2021-01-16: 2000 mL

## 2021-01-16 MED ORDER — DEXAMETHASONE SODIUM PHOSPHATE 10 MG/ML IJ SOLN
INTRAMUSCULAR | Status: DC | PRN
  Administered 2021-01-16: 8 mg via INTRAVENOUS

## 2021-01-16 MED ORDER — LIDOCAINE 2% (20 MG/ML) 5 ML SYRINGE
INTRAMUSCULAR | Status: DC | PRN
  Administered 2021-01-16: 50 mg via INTRAVENOUS

## 2021-01-16 MED ORDER — PROMETHAZINE HCL 25 MG/ML IJ SOLN: 6.2500 mg | INTRAMUSCULAR | Status: DC | PRN

## 2021-01-16 MED ORDER — CEFAZOLIN SODIUM-DEXTROSE 2-4 GM/100ML-% IV SOLN
2.0000 g | INTRAVENOUS | Status: AC
  Administered 2021-01-16: 2 g via INTRAVENOUS
  Filled 2021-01-16: qty 100

## 2021-01-16 MED ORDER — MAGNESIUM CITRATE PO SOLN: 1.0000 | Freq: Once | ORAL | Status: DC | PRN

## 2021-01-16 MED ORDER — METHOCARBAMOL 500 MG PO TABS
500.0000 mg | ORAL_TABLET | Freq: Four times a day (QID) | ORAL | Status: DC | PRN
  Administered 2021-01-16 – 2021-01-18 (×5): 500 mg via ORAL
  Filled 2021-01-16 (×5): qty 1

## 2021-01-16 MED ORDER — HYDROMORPHONE HCL 1 MG/ML IJ SOLN
1.0000 mg | INTRAMUSCULAR | Status: DC | PRN
  Administered 2021-01-17 – 2021-01-18 (×7): 1 mg via INTRAVENOUS
  Filled 2021-01-16 (×7): qty 1

## 2021-01-16 MED ORDER — CHLORHEXIDINE GLUCONATE CLOTH 2 % EX PADS
6.0000 | MEDICATED_PAD | Freq: Every day | CUTANEOUS | Status: DC
  Administered 2021-01-17 – 2021-01-18 (×2): 6 via TOPICAL

## 2021-01-16 MED ORDER — METOCLOPRAMIDE HCL 5 MG PO TABS: 5.0000 mg | ORAL_TABLET | Freq: Three times a day (TID) | ORAL | Status: DC | PRN

## 2021-01-16 SURGICAL SUPPLY — 42 items
BAG DECANTER FOR FLEXI CONT (MISCELLANEOUS) IMPLANT
BAG ZIPLOCK 12X15 (MISCELLANEOUS) IMPLANT
BLADE SAG 18X100X1.27 (BLADE) ×2 IMPLANT
COVER PERINEAL POST (MISCELLANEOUS) ×2 IMPLANT
COVER SURGICAL LIGHT HANDLE (MISCELLANEOUS) ×2 IMPLANT
COVER WAND RF STERILE (DRAPES) ×2 IMPLANT
CUP ACET PINNACLE SECTR 50MM (Hips) ×1 IMPLANT
DECANTER SPIKE VIAL GLASS SM (MISCELLANEOUS) ×2 IMPLANT
DRAPE STERI IOBAN 125X83 (DRAPES) ×2 IMPLANT
DRAPE U-SHAPE 47X51 STRL (DRAPES) ×4 IMPLANT
DRSG AQUACEL AG ADV 3.5X10 (GAUZE/BANDAGES/DRESSINGS) ×2 IMPLANT
DURAPREP 26ML APPLICATOR (WOUND CARE) ×2 IMPLANT
ELECT REM PT RETURN 15FT ADLT (MISCELLANEOUS) ×2 IMPLANT
EVACUATOR 1/8 PVC DRAIN (DRAIN) IMPLANT
GLOVE SRG 8 PF TXTR STRL LF DI (GLOVE) ×1 IMPLANT
GLOVE SURG ENC MOIS LTX SZ6 (GLOVE) IMPLANT
GLOVE SURG ENC MOIS LTX SZ7 (GLOVE) ×2 IMPLANT
GLOVE SURG ENC MOIS LTX SZ8 (GLOVE) ×2 IMPLANT
GLOVE SURG ENC TEXT LTX SZ7 (GLOVE) IMPLANT
GLOVE SURG UNDER POLY LF SZ6.5 (GLOVE) IMPLANT
GLOVE SURG UNDER POLY LF SZ8 (GLOVE) ×1
GLOVE SURG UNDER POLY LF SZ8.5 (GLOVE) IMPLANT
GOWN STRL REUS W/TWL LRG LVL3 (GOWN DISPOSABLE) ×2 IMPLANT
GOWN STRL REUS W/TWL XL LVL3 (GOWN DISPOSABLE) IMPLANT
HEAD FEMORAL 32 CERAMIC (Hips) ×2 IMPLANT
HOLDER FOLEY CATH W/STRAP (MISCELLANEOUS) ×2 IMPLANT
KIT TURNOVER KIT A (KITS) ×2 IMPLANT
LINER MARATHON 32 50 (Hips) ×2 IMPLANT
MANIFOLD NEPTUNE II (INSTRUMENTS) ×2 IMPLANT
PACK ANTERIOR HIP CUSTOM (KITS) ×2 IMPLANT
PENCIL SMOKE EVACUATOR COATED (MISCELLANEOUS) ×2 IMPLANT
PINNACLE SECTOR CUP 50MM (Hips) ×2 IMPLANT
STEM FEM ACTIS STD SZ4 (Stem) ×2 IMPLANT
STRIP CLOSURE SKIN 1/2X4 (GAUZE/BANDAGES/DRESSINGS) ×2 IMPLANT
SUT ETHIBOND NAB CT1 #1 30IN (SUTURE) ×2 IMPLANT
SUT MNCRL AB 4-0 PS2 18 (SUTURE) ×2 IMPLANT
SUT STRATAFIX 0 PDS 27 VIOLET (SUTURE) ×2
SUT VIC AB 2-0 CT1 27 (SUTURE) ×2
SUT VIC AB 2-0 CT1 TAPERPNT 27 (SUTURE) ×2 IMPLANT
SUTURE STRATFX 0 PDS 27 VIOLET (SUTURE) ×1 IMPLANT
SYR 50ML LL SCALE MARK (SYRINGE) IMPLANT
TRAY FOLEY MTR SLVR 14FR STAT (SET/KITS/TRAYS/PACK) ×2 IMPLANT

## 2021-01-16 NOTE — Anesthesia Postprocedure Evaluation (Signed)
Anesthesia Post Note  Patient: Robyn Johnston  Procedure(s) Performed: TOTAL HIP ARTHROPLASTY ANTERIOR APPROACH (Right Hip)     Patient location during evaluation: PACU Anesthesia Type: Spinal Level of consciousness: awake and alert Pain management: pain level controlled Vital Signs Assessment: post-procedure vital signs reviewed and stable Respiratory status: spontaneous breathing, nonlabored ventilation and respiratory function stable Cardiovascular status: blood pressure returned to baseline and stable Postop Assessment: no apparent nausea or vomiting Anesthetic complications: no   No complications documented.  Last Vitals:  Vitals:   01/16/21 1600 01/16/21 1615  BP: (!) 106/55 (!) 126/56  Pulse: (!) 55 66  Resp: 15 14  Temp:    SpO2: 96% 97%    Last Pain:  Vitals:   01/16/21 1600  PainSc: 0-No pain    LLE Motor Response: Non-purposeful movement (01/16/21 1624) LLE Sensation: Decreased (01/16/21 1624) RLE Motor Response: Non-purposeful movement (01/16/21 1624) RLE Sensation: Decreased (01/16/21 1624)      Lowella Curb

## 2021-01-16 NOTE — Anesthesia Procedure Notes (Signed)
Spinal  Patient location during procedure: OR Start time: 01/16/2021 1:04 PM End time: 01/16/2021 1:09 PM Staffing Performed: anesthesiologist  Anesthesiologist: Lowella Curb, MD Preanesthetic Checklist Completed: patient identified, IV checked, site marked, risks and benefits discussed, surgical consent, monitors and equipment checked, pre-op evaluation and timeout performed Spinal Block Patient position: sitting Prep: DuraPrep Patient monitoring: heart rate, cardiac monitor, continuous pulse ox and blood pressure Approach: midline Location: L3-4 Injection technique: single-shot Needle Needle type: Sprotte  Needle gauge: 24 G Needle length: 9 cm Assessment Sensory level: T4

## 2021-01-16 NOTE — Discharge Instructions (Addendum)
Information on my medicine - XARELTO (Rivaroxaban)  This medication education was reviewed with me or my healthcare representative as part of my discharge preparation. Why was Xarelto prescribed for you? Xarelto was prescribed for you to reduce the risk of blood clots forming after orthopedic surgery. The medical term for these abnormal blood clots is venous thromboembolism (VTE).  What do you need to know about xarelto ? Take your Xarelto ONCE DAILY at the same time every day. You may take it either with or without food.  If you have difficulty swallowing the tablet whole, you may crush it and mix in applesauce just prior to taking your dose.  Take Xarelto exactly as prescribed by your doctor and DO NOT stop taking Xarelto without talking to the doctor who prescribed the medication.  Stopping without other VTE prevention medication to take the place of Xarelto may increase your risk of developing a clot.  After discharge, you should have regular check-up appointments with your healthcare provider that is prescribing your Xarelto.    What do you do if you miss a dose? If you miss a dose, take it as soon as you remember on the same day then continue your regularly scheduled once daily regimen the next day. Do not take two doses of Xarelto on the same day.   Important Safety Information A possible side effect of Xarelto is bleeding. You should call your healthcare provider right away if you experience any of the following: ? Bleeding from an injury or your nose that does not stop. ? Unusual colored urine (red or dark brown) or unusual colored stools (red or black). ? Unusual bruising for unknown reasons. ? A serious fall or if you hit your head (even if there is no bleeding).  Some medicines may interact with Xarelto and might increase your risk of bleeding while on Xarelto. To help avoid this, consult your healthcare provider or pharmacist prior to using any new prescription or  non-prescription medications, including herbals, vitamins, non-steroidal anti-inflammatory drugs (NSAIDs) and supplements.  This website has more information on Xarelto: VisitDestination.com.br.   Ollen Gross, MD Total Joint Specialist [facility] [address] [place], [place] [address] [phone]  ANTERIOR APPROACH TOTAL HIP REPLACEMENT POSTOPERATIVE DIRECTIONS     Hip Rehabilitation, Guidelines Following Surgery  The results of a hip operation are greatly improved after range of motion and muscle strengthening exercises. Follow all safety measures which are given to protect your hip. If any of these exercises cause increased pain or swelling in your joint, decrease the amount until you are comfortable again. Then slowly increase the exercises. Call your caregiver if you have problems or questions.   BLOOD CLOT PREVENTION  Take a 10 mg Xarelto once a day for three weeks following surgery.   You may resume your vitamins/supplements once you have discontinued the Xarelto.  Do not take any NSAIDs (Advil, Aleve, Ibuprofen, Meloxicam, etc.) until you have discontinued the Xarelto.   HOME CARE INSTRUCTIONS   Remove items at home which could result in a fall. This includes throw rugs or furniture in walking pathways.   ICE to the affected hip as frequently as 20-30 minutes an hour and then as needed for pain and swelling. Continue to use ice on the hip for pain and swelling from surgery. You may notice swelling that will progress down to the foot and ankle. This is normal after surgery. Elevate the leg when you are not up walking on it.  Continue to use the breathing machine which will help keep your temperature down.  It is common for your temperature to cycle up and down following surgery, especially at night when you are not up moving around and exerting yourself.  The breathing machine keeps your lungs expanded and your temperature down.  DIET You may  resume your previous home diet once your are discharged from the hospital.  DRESSING / WOUND CARE / SHOWERING . You have an adhesive waterproof bandage over the incision. Leave this in place until your first follow-up appointment. Once you remove this you will not need to place another bandage.  . You may begin showering 3 days following surgery, but do not submerge the incision under water.  ACTIVITY . For the first 3-5 days, it is important to rest and keep the operative leg elevated. You should, as a general rule, rest for 50 minutes and walk/stretch for 10 minutes per hour. After 5 days, you may slowly increase activity as tolerated.  . Perform the exercises you were provided twice a day for about 15-20 minutes each session. Begin these 2 days following surgery. . Walk with your walker as instructed. Use the walker until you are comfortable transitioning to a cane. Walk with the cane in the opposite hand of the operative leg. You may discontinue the cane once you are comfortable and walking steadily. . Avoid periods of inactivity such as sitting longer than an hour when not asleep. This helps prevent blood clots.  . Do not drive a car for 6 weeks or until released by your surgeon.  . Do not drive while taking narcotics.  TED HOSE STOCKINGS Wear the elastic stockings on both legs for three weeks following surgery during the day. You may remove them at night while sleeping.  WEIGHT BEARING Weight bearing as tolerated with assist device (walker, cane, etc) as directed, use it as long as suggested by your surgeon or therapist, typically at least 4-6 weeks.  POSTOPERATIVE CONSTIPATION PROTOCOL Constipation - defined medically as fewer than three stools per week and severe constipation as less than one stool per week.  One of the most common issues patients have following surgery is constipation.  Even if you have a regular bowel pattern at home, your normal regimen is likely to be disrupted due  to multiple reasons following surgery.  Combination of anesthesia, postoperative narcotics, change in appetite and fluid intake all can affect your bowels.  In order to avoid complications following surgery, here are some recommendations in order to help you during your recovery period.  . Colace (docusate) - Pick up an over-the-counter form of Colace or another stool softener and take twice a day as long as you are requiring postoperative pain medications.  Take with a full glass of water daily.  If you experience loose stools or diarrhea, hold the colace until you stool forms back up.  If your symptoms do not get better within 1 week or if they get worse, check with your doctor. . Dulcolax (bisacodyl) - Pick up over-the-counter and take as directed by the product packaging as needed to assist with the movement of your bowels.  Take with a full glass of water.  Use this product as needed if not relieved by Colace only.  . MiraLax (polyethylene glycol) - Pick up over-the-counter to have on hand.  MiraLax is a solution that will increase the amount of water in your bowels to assist with bowel movements.  Take as directed and can   mix with a glass of water, juice, soda, coffee, or tea.  Take if you go more than two days without a movement.Do not use MiraLax more than once per day. Call your doctor if you are still constipated or irregular after using this medication for 7 days in a row.  If you continue to have problems with postoperative constipation, please contact the office for further assistance and recommendations.  If you experience "the worst abdominal pain ever" or develop nausea or vomiting, please contact the office immediatly for further recommendations for treatment.  ITCHING  If you experience itching with your medications, try taking only a single pain pill, or even half a pain pill at a time.  You can also use Benadryl over the counter for itching or also to help with sleep.    MEDICATIONS See your medication summary on the "After Visit Summary" that the nursing staff will review with you prior to discharge.  You may have some home medications which will be placed on hold until you complete the course of blood thinner medication.  It is important for you to complete the blood thinner medication as prescribed by your surgeon.  Continue your approved medications as instructed at time of discharge.  PRECAUTIONS If you experience chest pain or shortness of breath - call 911 immediately for transfer to the hospital emergency department.  If you develop a fever greater that 101 F, purulent drainage from wound, increased redness or drainage from wound, foul odor from the wound/dressing, or calf pain - CONTACT YOUR SURGEON.                                                   FOLLOW-UP APPOINTMENTS Make sure you keep all of your appointments after your operation with your surgeon and caregivers. You should call the office at the above phone number and make an appointment for approximately two weeks after the date of your surgery or on the date instructed by your surgeon outlined in the "After Visit Summary".  RANGE OF MOTION AND STRENGTHENING EXERCISES  These exercises are designed to help you keep full movement of your hip joint. Follow your caregiver's or physical therapist's instructions. Perform all exercises about fifteen times, three times per day or as directed. Exercise both hips, even if you have had only one joint replacement. These exercises can be done on a training (exercise) mat, on the floor, on a table or on a bed. Use whatever works the best and is most comfortable for you. Use music or television while you are exercising so that the exercises are a pleasant break in your day. This will make your life better with the exercises acting as a break in routine you can look forward to.  . Lying on your back, slowly slide your foot toward your buttocks, raising your knee up  off the floor. Then slowly slide your foot back down until your leg is straight again.  . Lying on your back spread your legs as far apart as you can without causing discomfort.  . Lying on your side, raise your upper leg and foot straight up from the floor as far as is comfortable. Slowly lower the leg and repeat.  . Lying on your back, tighten up the muscle in the front of your thigh (quadriceps muscles). You can do this by keeping your   leg straight and trying to raise your heel off the floor. This helps strengthen the largest muscle supporting your knee.   Lying on your back, tighten up the muscles of your buttocks both with the legs straight and with the knee bent at a comfortable angle while keeping your heel on the floor.   IF YOU ARE TRANSFERRED TO A SKILLED REHAB FACILITY If the patient is transferred to a skilled rehab facility following release from the hospital, a list of the current medications will be sent to the facility for the patient to continue.  When discharged from the skilled rehab facility, please have the facility set up the patient's Home Health Physical Therapy prior to being released. Also, the skilled facility will be responsible for providing the patient with their medications at time of release from the facility to include their pain medication, the muscle relaxants, and their blood thinner medication. If the patient is still at the rehab facility at time of the two week follow up appointment, the skilled rehab facility will also need to assist the patient in arranging follow up appointment in our office and any transportation needs.  MAKE SURE YOU:   Understand these instructions.   Get help right away if you are not doing well or get worse.    DENTAL ANTIBIOTICS:  In most cases prophylactic antibiotics for Dental procdeures after total joint surgery are not necessary.  Exceptions are as follows:  1. History of prior total joint infection  2. Severely  immunocompromised (Organ Transplant, cancer chemotherapy, Rheumatoid biologic meds such as Humera)  3. Poorly controlled diabetes (A1C &gt; 8.0, blood glucose over 200)  If you have one of these conditions, contact your surgeon for an antibiotic prescription, prior to your dental procedure.    Pick up stool softner and laxative for home use following surgery while on pain medications. Do not submerge incision under water. Please use good hand washing techniques while changing dressing each day. May shower starting three days after surgery. Please use a clean towel to pat the incision dry following showers. Continue to use ice for pain and swelling after surgery. Do not use any lotions or creams on the incision until instructed by your surgeon.

## 2021-01-16 NOTE — Op Note (Signed)
OPERATIVE REPORT- TOTAL HIP ARTHROPLASTY   PREOPERATIVE DIAGNOSIS: Osteoarthritis of the Right hip.   POSTOPERATIVE DIAGNOSIS: Osteoarthritis of the Right  hip.   PROCEDURE: Right total hip arthroplasty, anterior approach.   SURGEON: Ollen Gross, MD   ASSISTANT: Arther Abbott, PA-C  ANESTHESIA:  Spinal  ESTIMATED BLOOD LOSS:-350 mL    DRAINS: Hemovac x1.   COMPLICATIONS: None   CONDITION: PACU - hemodynamically stable.   BRIEF CLINICAL NOTE: Robyn Johnston is a 70 y.o. female who has advanced end-  stage arthritis of their Right  hip with progressively worsening pain and  dysfunction.The patient has failed nonoperative management and presents for  total hip arthroplasty.   PROCEDURE IN DETAIL: After successful administration of spinal  anesthetic, the traction boots for the [facility] bed were placed on both  feet and the patient was placed onto the [facility] bed, boots placed into the leg  holders. The Right hip was then isolated from the perineum with plastic  drapes and prepped and draped in the usual sterile fashion. ASIS and  greater trochanter were marked and a oblique incision was made, starting  at about 1 cm lateral and 2 cm distal to the ASIS and coursing towards  the anterior cortex of the femur. The skin was cut with a 10 blade  through subcutaneous tissue to the level of the fascia overlying the  tensor fascia lata muscle. The fascia was then incised in line with the  incision at the junction of the anterior third and posterior 2/3rd. The  muscle was teased off the fascia and then the interval between the TFL  and the rectus was developed. The Hohmann retractor was then placed at  the top of the femoral neck over the capsule. The vessels overlying the  capsule were cauterized and the fat on top of the capsule was removed.  A Hohmann retractor was then placed anterior underneath the rectus  femoris to give exposure to the entire anterior capsule. A T-shaped   capsulotomy was performed. The edges were tagged and the femoral head  was identified.       Osteophytes are removed off the superior acetabulum.  The femoral neck was then cut in situ with an oscillating saw. Traction  was then applied to the left lower extremity utilizing the [facility]  traction. The femoral head was then removed. Retractors were placed  around the acetabulum and then circumferential removal of the labrum was  performed. Osteophytes were also removed. Reaming starts at 45 mm to  medialize and  Increased in 2 mm increments to 49 mm. We reamed in  approximately 40 degrees of abduction, 20 degrees anteversion. A 50 mm  pinnacle acetabular shell was then impacted in anatomic position under  fluoroscopic guidance with excellent purchase. We did not need to place  any additional dome screws. A 32 mm neutral + 4 marathon liner was then  placed into the acetabular shell.       The femoral lift was then placed along the lateral aspect of the femur  just distal to the vastus ridge. The leg was  externally rotated and capsule  was stripped off the inferior aspect of the femoral neck down to the  level of the lesser trochanter, this was done with electrocautery. The femur was lifted after this was performed. The  leg was then placed in an extended and adducted position essentially delivering the femur. We also removed the capsule superiorly and the piriformis from the piriformis fossa  to gain excellent exposure of the  proximal femur. Rongeur was used to remove some cancellous bone to get  into the lateral portion of the proximal femur for placement of the  initial starter reamer. The starter broaches was placed  the starter broach  and was shown to go down the center of the canal. Broaching  with the Actis system was then performed starting at size 0  coursing  Up to size 4. A size 4 had excellent torsional and rotational  and axial stability. The trial standard offset neck was then  placed  with a 32 + 1 trial head. The hip was then reduced. We confirmed that  the stem was in the canal both on AP and lateral x-rays. It also has excellent sizing. The hip was reduced with outstanding stability through full extension and full external rotation.. AP pelvis was taken and the leg lengths were measured and found to be equal. Hip was then dislocated again and the femoral head and neck removed. The  femoral broach was removed. Size 4 Actis stem with a standard offset  neck was then impacted into the femur following native anteversion. Has  excellent purchase in the canal. Excellent torsional and rotational and  axial stability. It is confirmed to be in the canal on AP and lateral  fluoroscopic views. The 32 + 1 ceramic head was placed and the hip  reduced with outstanding stability. Again AP pelvis was taken and it  confirmed that the leg lengths were equal. The wound was then copiously  irrigated with saline solution and the capsule reattached and repaired  with Ethibond suture. 30 ml of .25% Bupivicaine was  injected into the capsule and into the edge of the tensor fascia lata as well as subcutaneous tissue. The fascia overlying the tensor fascia lata was then closed with a running #1 V-Loc. Subcu was closed with interrupted 2-0 Vicryl and subcuticular running 4-0 Monocryl. Incision was cleaned  and dried. Steri-Strips and a bulky sterile dressing applied. Hemovac  drain was hooked to suction and then the patient was awakened and transported to  recovery in stable condition.        Please note that a surgical assistant was a medical necessity for this procedure to perform it in a safe and expeditious manner. Assistant was necessary to provide appropriate retraction of vital neurovascular structures and to prevent femoral fracture and allow for anatomic placement of the prosthesis.  Ollen Gross, M.D.

## 2021-01-16 NOTE — Anesthesia Preprocedure Evaluation (Signed)
Anesthesia Evaluation  Patient identified by MRN, date of birth, ID band Patient awake    Reviewed: Allergy & Precautions, NPO status , Patient's Chart, lab work & pertinent test results  History of Anesthesia Complications (+) PONV  Airway Mallampati: II  TM Distance: >3 FB Neck ROM: Full    Dental no notable dental hx. (+) Partial Upper, Partial Lower,    Pulmonary former smoker,    Pulmonary exam normal breath sounds clear to auscultation       Cardiovascular hypertension, Pt. on medications Normal cardiovascular exam Rhythm:Regular Rate:Normal     Neuro/Psych negative neurological ROS  negative psych ROS   GI/Hepatic Neg liver ROS, GERD  ,  Endo/Other  diabetes  Renal/GU negative Renal ROS     Musculoskeletal  (+) Arthritis ,   Abdominal (+) + obese,   Peds  Hematology Lab Results      Component                Value               Date                      WBC                      5.7                 05/20/2020                HGB                      11.8 (L)            05/20/2020                HCT                      35.6 (L)            05/20/2020                MCV                      99.4                05/20/2020                PLT                      215                 05/20/2020              Anesthesia Other Findings   Reproductive/Obstetrics negative OB ROS                             Anesthesia Physical  Anesthesia Plan  ASA: III  Anesthesia Plan: Spinal   Post-op Pain Management:    Induction:   PONV Risk Score and Plan: 3 and Ondansetron, Dexamethasone, Treatment may vary due to age or medical condition and Midazolam  Airway Management Planned: Natural Airway  Additional Equipment:   Intra-op Plan:   Post-operative Plan:   Informed Consent: I have reviewed the patients History and Physical, chart, labs and discussed the procedure including the risks,  benefits and alternatives for the proposed anesthesia with the patient  or authorized representative who has indicated his/her understanding and acceptance.     Dental advisory given  Plan Discussed with:   Anesthesia Plan Comments:         Anesthesia Quick Evaluation

## 2021-01-16 NOTE — Transfer of Care (Signed)
Immediate Anesthesia Transfer of Care Note  Patient: Robyn Johnston  Procedure(s) Performed: Procedure(s) with comments: TOTAL HIP ARTHROPLASTY ANTERIOR APPROACH (Right) -  Patient Location: PACU  Anesthesia Type:Spinal  Level of Consciousness: awake, alert  and oriented  Airway & Oxygen Therapy: Patient Spontanous Breathing  Post-op Assessment: Report given to RN and Post -op Vital signs reviewed and stable  Post vital signs: Reviewed and stable  Last Vitals:  Vitals:   01/16/21 0949  BP: (!) 152/63  Pulse: (!) 57  Resp: 15  Temp: 36.7 C  SpO2: 98%    Complications: No apparent anesthesia complications

## 2021-01-16 NOTE — Interval H&P Note (Signed)
History and Physical Interval Note:  01/16/2021 10:39 AM  Robyn Johnston  has presented today for surgery, with the diagnosis of right hip osteoarthritis.  The various methods of treatment have been discussed with the patient and family. After consideration of risks, benefits and other options for treatment, the patient has consented to  Procedure(s) with comments: TOTAL HIP ARTHROPLASTY ANTERIOR APPROACH (Right) - as a surgical intervention.  The patient's history has been reviewed, patient examined, no change in status, stable for surgery.  I have reviewed the patient's chart and labs.  Questions were answered to the patient's satisfaction.     Homero Fellers Mackie Goon

## 2021-01-17 ENCOUNTER — Encounter (HOSPITAL_COMMUNITY): Payer: Self-pay | Admitting: Orthopedic Surgery

## 2021-01-17 DIAGNOSIS — M1611 Unilateral primary osteoarthritis, right hip: Secondary | ICD-10-CM | POA: Diagnosis not present

## 2021-01-17 LAB — BASIC METABOLIC PANEL
Anion gap: 9 (ref 5–15)
BUN: 14 mg/dL (ref 8–23)
CO2: 27 mmol/L (ref 22–32)
Calcium: 9.2 mg/dL (ref 8.9–10.3)
Chloride: 102 mmol/L (ref 98–111)
Creatinine, Ser: 1.05 mg/dL — ABNORMAL HIGH (ref 0.44–1.00)
GFR, Estimated: 58 mL/min — ABNORMAL LOW (ref >60)
Glucose, Bld: 158 mg/dL — ABNORMAL HIGH (ref 70–99)
Potassium: 4.3 mmol/L (ref 3.5–5.1)
Sodium: 138 mmol/L (ref 135–145)

## 2021-01-17 LAB — CBC
HCT: 31.4 % — ABNORMAL LOW (ref 36.0–46.0)
Hemoglobin: 10.5 g/dL — ABNORMAL LOW (ref 12.0–15.0)
MCH: 31.7 pg (ref 26.0–34.0)
MCHC: 33.4 g/dL (ref 30.0–36.0)
MCV: 94.9 fL (ref 80.0–100.0)
Platelets: 179 10*3/uL (ref 150–400)
RBC: 3.31 MIL/uL — ABNORMAL LOW (ref 3.87–5.11)
RDW: 11.9 % (ref 11.5–15.5)
WBC: 8.2 10*3/uL (ref 4.0–10.5)
nRBC: 0 % (ref 0.0–0.2)

## 2021-01-17 MED ORDER — RIVAROXABAN 10 MG PO TABS: 10.0000 mg | ORAL_TABLET | Freq: Every day | ORAL | 0 refills | Status: AC

## 2021-01-17 MED ORDER — HYDROMORPHONE HCL 2 MG PO TABS
2.0000 mg | ORAL_TABLET | ORAL | Status: DC | PRN
  Administered 2021-01-17 – 2021-01-18 (×5): 4 mg via ORAL
  Filled 2021-01-17 (×6): qty 2

## 2021-01-17 MED ORDER — HYDROMORPHONE HCL 2 MG PO TABS: 2.0000 mg | ORAL_TABLET | ORAL | 0 refills | Status: AC | PRN

## 2021-01-17 MED ORDER — OXYCODONE HCL 5 MG PO TABS: 5.0000 mg | ORAL_TABLET | Freq: Four times a day (QID) | ORAL | 0 refills | Status: DC | PRN

## 2021-01-17 MED ORDER — HYDROMORPHONE HCL 2 MG PO TABS: 2.0000 mg | ORAL_TABLET | ORAL | 0 refills | Status: DC | PRN

## 2021-01-17 MED ORDER — METHOCARBAMOL 500 MG PO TABS: 500.0000 mg | ORAL_TABLET | Freq: Four times a day (QID) | ORAL | 0 refills | Status: AC | PRN

## 2021-01-17 NOTE — Progress Notes (Signed)
   Subjective: 1 Day Post-Op Procedure(s) (LRB): TOTAL HIP ARTHROPLASTY ANTERIOR APPROACH (Right) Patient reports pain as mild.   Patient seen in rounds by [name]. Patient is well, and has had no acute complaints or problems. Had issues with pain control last night, medications switched to IV dilaudid and feeling much better this AM. Denies chest pain or SOB. Foley catheter removed this AM. We will begin therapy today.  Objective: Vital signs in last 24 hours: Temp:  [97.4 F (36.3 C)-98.5 F (36.9 C)] 98.1 F (36.7 C) (02/08 0458) Pulse Rate:  [44-93] 71 (02/08 0458) Resp:  [8-20] 16 (02/08 0458) BP: (88-152)/(39-69) 122/52 (02/08 0458) SpO2:  [95 %-100 %] 97 % (02/08 0458) Weight:  [79.4 kg] 79.4 kg (02/07 1715)  Intake/Output from previous day:  Intake/Output Summary (Last 24 hours) at 01/17/2021 0728 Last data filed at 01/17/2021 0600 Gross per 24 hour  Intake 4384.67 ml  Output 4200 ml  Net 184.67 ml    Labs: Recent Labs    01/17/21 0317  HGB 10.5*   Recent Labs    01/17/21 0317  WBC 8.2  RBC 3.31*  HCT 31.4*  PLT 179   Recent Labs    01/17/21 0317  NA 138  K 4.3  CL 102  CO2 27  BUN 14  CREATININE 1.05*  GLUCOSE 158*  CALCIUM 9.2   Exam: General - Patient is Alert and Oriented Extremity - Neurologically intact Neurovascular intact Sensation intact distally Dorsiflexion/Plantar flexion intact Dressing - dressing C/D/I Motor Function - intact, moving foot and toes well on exam.   Past Medical History:  Diagnosis Date  . Arthritis   . Complication of anesthesia   . Diabetes mellitus without complication (HCC)    type 2   . GERD (gastroesophageal reflux disease)   . Hypertension   . PONV (postoperative nausea and vomiting)     Assessment/Plan: 1 Day Post-Op Procedure(s) (LRB): TOTAL HIP ARTHROPLASTY ANTERIOR APPROACH (Right) Active Problems:   Primary osteoarthritis of right hip  Estimated body mass index is 30.04 kg/m as  calculated from the following:   Height as of this encounter: 5\' 4"  (1.626 m).   Weight as of this encounter: 79.4 kg. Advance diet Up with therapy D/C IV fluids  DVT Prophylaxis - Xarelto Weight bearing as tolerated. Begin therapy.  Plan is to go Home after hospital stay. Plan for discharge after two sessions of PT if meeting goals. HEP. Follow-up in the office on February 22nd.  The PDMP database was reviewed today prior to any opioid medications being prescribed to this patient.   February 24, PA-C Orthopedic Surgery [phone] 01/17/2021, 7:28 AM

## 2021-01-17 NOTE — Progress Notes (Signed)
Physical Therapy Treatment Patient Details Name: Robyn Johnston MRN: [id number] DOB: Cynithia 21, 1952 Today's Date: 01/17/2021    History of Present Illness 70 yo female s/p R THA-direct anterior 01/16/21. 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 She cried during session today. Unfortunately, she has not been able to meet her PT goals thus she is not ready to d/c yet. Will continue to follow and progress activity as able.    Follow Up Recommendations  Follow surgeon's recommendation for DC plan and follow-up therapies     Equipment Recommendations  None recommended by PT    Recommendations for Other Services       Precautions / Restrictions Precautions Precautions: Fall Restrictions Weight Bearing Restrictions: No Other Position/Activity Restrictions: WBAT    Mobility  Bed Mobility Overal bed mobility: Needs Assistance Bed Mobility: Sit to Supine      Sit to supine: Min assist;HOB elevated   General bed mobility comments: Assist for R LE. Cues for safety, technique. Increased time.  Transfers Overall transfer level: Needs assistance Equipment used: Rolling walker (2 wheeled) Transfers: Sit to/from Stand Sit to Stand: Min assist         General transfer comment: Cues for safety, technique, hand/LE placement. Increased time and effort. Assist to rise, steady, control descent.  Ambulation/Gait Ambulation/Gait assistance: Min assist Gait Distance (Feet): 15 Feet (x2) Assistive device: Rolling walker (2 wheeled) Gait Pattern/deviations: Step-to pattern;Antalgic     General Gait Details: Cues for safety, technique, sequence. Slow, effortful gait. Pt c/o burning; crying. Followed closely with recliner and used it to transport her back to room   Stairs             Wheelchair Mobility    Modified Rankin (Stroke Patients Only)       Balance Overall balance assessment: Needs assistance         Standing balance  support: Bilateral upper extremity supported Standing balance-Leahy Scale: Poor                              Cognition Arousal/Alertness: Awake/alert Behavior During Therapy: WFL for tasks assessed/performed Overall Cognitive Status: Within Functional Limits for tasks assessed                                        Exercises      General Comments        Pertinent Vitals/Pain Pain Assessment: 0-10 Pain Score: 9  Pain Location: R thigh Pain Descriptors / Indicators: Burning;Grimacing;Guarding;Crying Pain Intervention(s): Limited activity within patient's tolerance;Monitored during session;Repositioned;RN gave pain meds during session    Home Living Family/patient expects to be discharged to:: Private residence Living Arrangements: Spouse/significant other Available Help at Discharge: Family Type of Home: House Home Access: Stairs to enter   Home Layout: One level Home Equipment: Environmental consultant - 2 wheels;Cane - single point;Bedside commode      Prior Function Level of Independence: Independent          PT Goals (current goals can now be found in the care plan section) Acute Rehab PT Goals Patient Stated Goal: less pain. to be able to go home today PT Goal Formulation: With patient Time For Goal Achievement: 01/31/21 Potential to Achieve Goals: Good Progress towards PT goals: Progressing toward goals    Frequency  7X/week      PT Plan Current plan remains appropriate    Co-evaluation              AM-PAC PT "6 Clicks" Mobility   Outcome Measure  Help needed turning from your back to your side while in a flat bed without using bedrails?: A Little Help needed moving from lying on your back to sitting on the side of a flat bed without using bedrails?: A Little Help needed moving to and from a bed to a chair (including a wheelchair)?: A Little Help needed standing up from a chair using your arms (e.g., wheelchair or bedside  chair)?: A Little Help needed to walk in hospital room?: A Lot Help needed climbing 3-5 steps with a railing? : A Lot 6 Click Score: 16    End of Session Equipment Utilized During Treatment: Gait belt Activity Tolerance: Patient limited by pain Patient left: in bed;with call bell/phone within reach;with family/visitor present   PT Visit Diagnosis: Pain;Difficulty in walking, not elsewhere classified (R26.2) Pain - Right/Left: Right Pain - part of body: Hip;Leg     Time: 8413-2440 PT Time Calculation (min) (ACUTE ONLY): 42 min  Charges:  $Gait Training: 38-52 mins                        Faye Ramsay, PT Acute Rehabilitation  Office: [phone] Pager: [phone]

## 2021-01-17 NOTE — TOC Progression Note (Signed)
Transition of Care [facility]) - Progression Note    Patient Details  Name: Robyn Johnston MRN: [id number] Date of Birth: [birth year]  [facility] of [facility]) CM/SW Contact  Armanda Heritage, RN Phone Number: 01/17/2021, 11:08 AM  Clinical Narrative:    Patient to dc home with HEP, no dme needs.   Expected Discharge Plan: Home/Self Care Barriers to Discharge: No Barriers Identified  Expected Discharge Plan and Services Expected Discharge Plan: Home/Self Care   Discharge Planning Services: CM Consult     Expected Discharge Date: 01/17/21               DME Arranged: N/A DME Agency: NA                   Social Determinants of Health (SDOH) Interventions    Readmission Risk Interventions No flowsheet data found.

## 2021-01-17 NOTE — Plan of Care (Signed)
  Problem: Clinical Measurements: Goal: Diagnostic test results will improve Outcome: Progressing   Problem: Clinical Measurements: Goal: Respiratory complications will improve Outcome: Progressing   Problem: Clinical Measurements: Goal: Cardiovascular complication will be avoided Outcome: Progressing   Problem: Elimination: Goal: Will not experience complications related to bowel motility Outcome: Progressing   Problem: Skin Integrity: Goal: Risk for impaired skin integrity will decrease Outcome: Progressing   

## 2021-01-17 NOTE — Evaluation (Signed)
Physical Therapy Evaluation Patient Details Name: Jalasia Finnicum MRN: [id number] DOB: Destinee 19, 1952 Today's Date: 01/17/2021   History of Present Illness  70 yo female s/p R THA-direct anterior 01/16/21. 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 She was only able to walk ~7 feet this session. Mobility limited by "burning" pain along R LE. Will continue to follow and progress activity as able.     Follow Up Recommendations Follow surgeon's recommendation for DC plan and follow-up therapies    Equipment Recommendations  None recommended by PT    Recommendations for Other Services       Precautions / Restrictions Precautions Precautions: Fall Restrictions Weight Bearing Restrictions: No Other Position/Activity Restrictions: WBAT      Mobility  Bed Mobility Overal bed mobility: Needs Assistance Bed Mobility: Supine to Sit     Supine to sit: Min assist;HOB elevated     General bed mobility comments: Assist for R LE. Cues for safety, technique. Increased time.    Transfers Overall transfer level: Needs assistance Equipment used: Rolling walker (2 wheeled) Transfers: Sit to/from Stand Sit to Stand: Min assist         General transfer comment: Cues for safety, techique, hand/LE placement. Increased time and effort. Assist to rise, steady, control descent.  Ambulation/Gait Ambulation/Gait assistance: Min assist Gait Distance (Feet): 7 Feet Assistive device: Rolling walker (2 wheeled) Gait Pattern/deviations: Step-to pattern;Antalgic     General Gait Details: Cues for safety, technique, sequence. Slow, effortful gait. Pt c/o burning. Followed closely with recliner and used it to transport her back to room  Stairs            Wheelchair Mobility    Modified Rankin (Stroke Patients Only)       Balance Overall balance assessment: Needs assistance         Standing balance support: Bilateral upper extremity  supported Standing balance-Leahy Scale: Poor                               Pertinent Vitals/Pain Pain Assessment: 0-10 Pain Score: 9  Pain Location: R thigh Pain Descriptors / Indicators: Burning;Grimacing;Guarding Pain Intervention(s): Limited activity within patient's tolerance;Monitored during session;Repositioned    Home Living Family/patient expects to be discharged to:: Private residence Living Arrangements: Spouse/significant other Available Help at Discharge: Family Type of Home: House Home Access: Stairs to enter   Secretary/administrator of Steps: 1 Home Layout: One level Home Equipment: Environmental consultant - 2 wheels;Cane - single point;Bedside commode      Prior Function Level of Independence: Independent               Hand Dominance        Extremity/Trunk Assessment   Upper Extremity Assessment Upper Extremity Assessment: Overall WFL for tasks assessed    Lower Extremity Assessment Lower Extremity Assessment: Generalized weakness    Cervical / Trunk Assessment Cervical / Trunk Assessment: Normal  Communication   Communication: No difficulties  Cognition Arousal/Alertness: Awake/alert Behavior During Therapy: WFL for tasks assessed/performed Overall Cognitive Status: Within Functional Limits for tasks assessed                                        General Comments      Exercises     Assessment/Plan    PT Assessment  Patient needs continued PT services  PT Problem List Decreased strength;Decreased range of motion;Decreased mobility;Pain;Decreased balance;Decreased activity tolerance;Decreased knowledge of use of DME       PT Treatment Interventions DME instruction;Gait training;Therapeutic activities;Therapeutic exercise;Patient/family education;Balance training;Functional mobility training;Stair training    PT Goals (Current goals can be found in the Care Plan section)  Acute Rehab PT Goals Patient Stated Goal: less  pain. to be able to go home today PT Goal Formulation: With patient Time For Goal Achievement: 01/31/21 Potential to Achieve Goals: Good    Frequency 7X/week   Barriers to discharge        Co-evaluation               AM-PAC PT "6 Clicks" Mobility  Outcome Measure Help needed turning from your back to your side while in a flat bed without using bedrails?: A Little Help needed moving from lying on your back to sitting on the side of a flat bed without using bedrails?: A Little Help needed moving to and from a bed to a chair (including a wheelchair)?: A Little Help needed standing up from a chair using your arms (e.g., wheelchair or bedside chair)?: A Little Help needed to walk in hospital room?: A Lot Help needed climbing 3-5 steps with a railing? : A Lot 6 Click Score: 16    End of Session Equipment Utilized During Treatment: Gait belt Activity Tolerance: Patient limited by pain Patient left: in chair;with call bell/phone within reach   PT Visit Diagnosis: Pain;Difficulty in walking, not elsewhere classified (R26.2) Pain - Right/Left: Right Pain - part of body: Hip;Leg    Time: 1103-1130 PT Time Calculation (min) (ACUTE ONLY): 27 min   Charges:   PT Evaluation $PT Eval Low Complexity: 1 Low PT Treatments $Gait Training: 8-22 mins           Faye Ramsay, PT Acute Rehabilitation  Office: [phone] Pager: [phone]

## 2021-01-18 DIAGNOSIS — M1611 Unilateral primary osteoarthritis, right hip: Secondary | ICD-10-CM | POA: Diagnosis not present

## 2021-01-18 LAB — BASIC METABOLIC PANEL
Anion gap: 9 (ref 5–15)
BUN: 17 mg/dL (ref 8–23)
CO2: 27 mmol/L (ref 22–32)
Calcium: 9.4 mg/dL (ref 8.9–10.3)
Chloride: 101 mmol/L (ref 98–111)
Creatinine, Ser: 0.95 mg/dL (ref 0.44–1.00)
GFR, Estimated: >60 mL/min (ref >60)
Glucose, Bld: 149 mg/dL — ABNORMAL HIGH (ref 70–99)
Potassium: 3.5 mmol/L (ref 3.5–5.1)
Sodium: 137 mmol/L (ref 135–145)

## 2021-01-18 LAB — CBC
HCT: 30 % — ABNORMAL LOW (ref 36.0–46.0)
Hemoglobin: 10.1 g/dL — ABNORMAL LOW (ref 12.0–15.0)
MCH: 32.1 pg (ref 26.0–34.0)
MCHC: 33.7 g/dL (ref 30.0–36.0)
MCV: 95.2 fL (ref 80.0–100.0)
Platelets: 183 10*3/uL (ref 150–400)
RBC: 3.15 MIL/uL — ABNORMAL LOW (ref 3.87–5.11)
RDW: 12.5 % (ref 11.5–15.5)
WBC: 8.4 10*3/uL (ref 4.0–10.5)
nRBC: 0 % (ref 0.0–0.2)

## 2021-01-18 NOTE — Progress Notes (Signed)
   Subjective: 2 Days Post-Op Procedure(s) (LRB): TOTAL HIP ARTHROPLASTY ANTERIOR APPROACH (Right) Patient reports pain as mild.   Patient seen in rounds for [name]. Patient is well, and has had no acute complaints or problems other than discomfort in the right hip. No acute events overnight. She reports her pain was much better yesterday evening, and she slept 5-6 hours. She is very motivated to get home today. Voiding without difficulty, positive flatus.  We will continue therapy today.   Objective: Vital signs in last 24 hours: Temp:  [97.8 F (36.6 C)-98.6 F (37 C)] 98.2 F (36.8 C) (02/09 0514) Pulse Rate:  [72-79] 74 (02/09 0514) Resp:  [15-18] 18 (02/09 0514) BP: (114-134)/(45-58) 129/57 (02/09 0514) SpO2:  [93 %-98 %] 98 % (02/09 0514)  Intake/Output from previous day:  Intake/Output Summary (Last 24 hours) at 01/18/2021 0725 Last data filed at 01/18/2021 7672 Gross per 24 hour  Intake 1560 ml  Output 1500 ml  Net 60 ml     Intake/Output this shift: No intake/output data recorded.  Labs: Recent Labs    01/17/21 0317 01/18/21 0333  HGB 10.5* 10.1*   Recent Labs    01/17/21 0317 01/18/21 0333  WBC 8.2 8.4  RBC 3.31* 3.15*  HCT 31.4* 30.0*  PLT 179 183   Recent Labs    01/17/21 0317 01/18/21 0333  NA 138 137  K 4.3 3.5  CL 102 101  CO2 27 27  BUN 14 17  CREATININE 1.05* 0.95  GLUCOSE 158* 149*  CALCIUM 9.2 9.4   No results for input(s): LABPT, INR in the last 72 hours.  Exam: General - Patient is Alert and Oriented Extremity - Neurologically intact Sensation intact distally Intact pulses distally Dorsiflexion/Plantar flexion intact Dressing - dressing C/D/I Motor Function - intact, moving foot and toes well on exam.   Past Medical History:  Diagnosis Date  . Arthritis   . Complication of anesthesia   . Diabetes mellitus without complication (HCC)    type 2   . GERD (gastroesophageal reflux disease)   . Hypertension   . PONV  (postoperative nausea and vomiting)     Assessment/Plan: 2 Days Post-Op Procedure(s) (LRB): TOTAL HIP ARTHROPLASTY ANTERIOR APPROACH (Right) Active Problems:   Primary osteoarthritis of right hip  Estimated body mass index is 30.04 kg/m as calculated from the following:   Height as of this encounter: 5\' 4"  (1.626 m).   Weight as of this encounter: 79.4 kg. Advance diet Up with therapy D/C IV fluids  DVT Prophylaxis - Xarelto Weight bearing as tolerated.  Plan is to go Home after hospital stay with HEP. Plan for discharge today following 1-2 sessions of PT as long as she is meeting her goals and pain is managed. Follow up in the office in 2 weeks.   , PA-C Orthopedic Surgery [phone] 01/18/2021, 7:25 AM

## 2021-01-18 NOTE — Progress Notes (Signed)
Pt ready for discharge. Pt verbalizes understanding of all discharge instructions. All questions answered. pts spouse already picked up all prescriptions from drug store. Pt to car with all belongings, discharge paperwork and equipment.

## 2021-01-23 NOTE — Discharge Summary (Signed)
Physician Discharge Summary   Patient ID: Robyn Johnston MRN: [id number] DOB/AGE: 1951/01/24 70 y.o.  Admit date: 01/16/2021 Discharge date: 01/18/2021  Primary Diagnosis: Osteoarthritis, right hip   Admission Diagnoses:  Past Medical History:  Diagnosis Date  . Arthritis   . Complication of anesthesia   . Diabetes mellitus without complication (HCC)    type 2   . GERD (gastroesophageal reflux disease)   . Hypertension   . PONV (postoperative nausea and vomiting)    Discharge Diagnoses:   Active Problems:   Primary osteoarthritis of right hip  Estimated body mass index is 30.04 kg/m as calculated from the following:   Height as of this encounter: 5\' 4"  (1.626 m).   Weight as of this encounter: 79.4 kg.  Procedure:  Procedure(s) (LRB): TOTAL HIP ARTHROPLASTY ANTERIOR APPROACH (Right)   Consults: None  HPI: Robyn Johnston is a 70 y.o. female who has advanced end-stage arthritis of their Right  hip with progressively worsening pain and dysfunction.The patient has failed nonoperative management and presents for total hip arthroplasty.   Laboratory Data: Admission on 01/16/2021, Discharged on 01/18/2021  Component Date Value Ref Range Status  . Glucose-Capillary 01/16/2021 86  70 - 99 mg/dL Final   Glucose reference range applies only to samples taken after fasting for at least 8 hours.  . Comment 1 01/16/2021 Notify RN   Final  . Comment 2 01/16/2021 Document in Chart   Final  . Glucose-Capillary 01/16/2021 94  70 - 99 mg/dL Final   Glucose reference range applies only to samples taken after fasting for at least 8 hours.  . WBC 01/17/2021 8.2  4.0 - 10.5 K/uL Final  . RBC 01/17/2021 3.31* 3.87 - 5.11 MIL/uL Final  . Hemoglobin 01/17/2021 10.5* 12.0 - 15.0 g/dL Final  . HCT 03/17/2021 31.4* 36.0 - 46.0 % Final  . MCV 01/17/2021 94.9  80.0 - 100.0 fL Final  . MCH 01/17/2021 31.7  26.0 - 34.0 pg Final  . MCHC 01/17/2021 33.4  30.0 - 36.0 g/dL Final  . RDW 03/17/2021 11.9  11.5 -  15.5 % Final  . Platelets 01/17/2021 179  150 - 400 K/uL Final  . nRBC 01/17/2021 0.0  0.0 - 0.2 % Final   Performed at [facility], [address], [place], [place]  . Sodium 01/17/2021 138  135 - 145 mmol/L Final  . Potassium 01/17/2021 4.3  3.5 - 5.1 mmol/L Final  . Chloride 01/17/2021 102  98 - 111 mmol/L Final  . CO2 01/17/2021 27  22 - 32 mmol/L Final  . Glucose, Bld 01/17/2021 158* 70 - 99 mg/dL Final   Glucose reference range applies only to samples taken after fasting for at least 8 hours.  . BUN 01/17/2021 14  8 - 23 mg/dL Final  . Creatinine, Ser 01/17/2021 1.05* 0.44 - 1.00 mg/dL Final  . Calcium 03/17/2021 9.2  8.9 - 10.3 mg/dL Final  . GFR, Estimated 01/17/2021 58* >60 mL/min Final   Comment: (NOTE) Calculated using the CKD-EPI Creatinine Equation (2021)   . Anion gap 01/17/2021 9  5 - 15 Final   Performed at [facility], [address], [place], [place]  . WBC 01/18/2021 8.4  4.0 - 10.5 K/uL Final  . RBC 01/18/2021 3.15* 3.87 - 5.11 MIL/uL Final  . Hemoglobin 01/18/2021 10.1* 12.0 - 15.0 g/dL Final  . HCT 03/18/2021 30.0* 36.0 - 46.0 % Final  . MCV 01/18/2021 95.2  80.0 - 100.0 fL  Final  . MCH 01/18/2021 32.1  26.0 - 34.0 pg Final  . MCHC 01/18/2021 33.7  30.0 - 36.0 g/dL Final  . RDW 10/03/8526 12.5  11.5 - 15.5 % Final  . Platelets 01/18/2021 183  150 - 400 K/uL Final  . nRBC 01/18/2021 0.0  0.0 - 0.2 % Final   Performed at [facility], [address], [place], [place] [address]  . Sodium 01/18/2021 137  135 - 145 mmol/L Final  . Potassium 01/18/2021 3.5  3.5 - 5.1 mmol/L Final   Comment: DELTA CHECK NOTED NO VISIBLE HEMOLYSIS   . Chloride 01/18/2021 101  98 - 111 mmol/L Final  . CO2 01/18/2021 27  22 - 32 mmol/L Final  . Glucose, Bld 01/18/2021 149* 70 - 99 mg/dL Final   Glucose reference range applies only to samples taken after fasting for at least 8 hours.  . BUN 01/18/2021 17   8 - 23 mg/dL Final  . Creatinine, Ser 01/18/2021 0.95  0.44 - 1.00 mg/dL Final  . Calcium 35/36/1443 9.4  8.9 - 10.3 mg/dL Final  . GFR, Estimated 01/18/2021 >60  >60 mL/min Final   Comment: (NOTE) Calculated using the CKD-EPI Creatinine Equation (2021)   . Anion gap 01/18/2021 9  5 - 15 Final   Performed at [facility], [address], [place], [place] [address]  Hospital Outpatient Visit on 01/12/2021  Component Date Value Ref Range Status  . SARS Coronavirus 2 01/12/2021 NEGATIVE  NEGATIVE Final   Comment: (NOTE) SARS-CoV-2 target nucleic acids are NOT DETECTED.  The SARS-CoV-2 RNA is generally detectable in upper and lower respiratory specimens during the acute phase of infection. Negative results do not preclude SARS-CoV-2 infection, do not rule out co-infections with other pathogens, and should not be used as the sole basis for treatment or other patient management decisions. Negative results must be combined with clinical observations, patient history, and epidemiological information. The expected result is Negative.  Fact Sheet for Patients: HairSlick.no  Fact Sheet for Healthcare Providers: quierodirigir.com  This test is not yet approved or cleared by the [place] FDA and  has been authorized for detection and/or diagnosis of SARS-CoV-2 by FDA under an Emergency Use Authorization (EUA). This EUA will remain  in effect (meaning this test can be used) for the duration of the COVID-19 declaration under Se                          ction 564(b)(1) of the Act, 21 U.S.C. section 360bbb-3(b)(1), unless the authorization is terminated or revoked sooner.  Performed at [facility], [address] [place], [place] [address]   Hospital Outpatient Visit on 01/06/2021  Component Date Value Ref Range Status  . Sodium 01/06/2021 139  135 - 145 mmol/L Final  . Potassium 01/06/2021 4.6  3.5 - 5.1  mmol/L Final  . Chloride 01/06/2021 103  98 - 111 mmol/L Final  . CO2 01/06/2021 29  22 - 32 mmol/L Final  . Glucose, Bld 01/06/2021 104* 70 - 99 mg/dL Final   Glucose reference range applies only to samples taken after fasting for at least 8 hours.  . BUN 01/06/2021 15  8 - 23 mg/dL Final  . Creatinine, Ser 01/06/2021 0.88  0.44 - 1.00 mg/dL Final  . Calcium 95/08/3266 9.9  8.9 - 10.3 mg/dL Final  . GFR, Estimated 01/06/2021 >60  >60 mL/min Final   Comment: (NOTE) Calculated using the  CKD-EPI Creatinine Equation (2021)   . Anion gap 01/06/2021 7  5 - 15 Final   Performed at [facility], [address] [place], [place] [address]  . WBC 01/06/2021 5.5  4.0 - 10.5 K/uL Final  . RBC 01/06/2021 3.59* 3.87 - 5.11 MIL/uL Final  . Hemoglobin 01/06/2021 11.5* 12.0 - 15.0 g/dL Final  . HCT 60/45/409801/28/2022 34.7* 36.0 - 46.0 % Final  . MCV 01/06/2021 96.7  80.0 - 100.0 fL Final  . MCH 01/06/2021 32.0  26.0 - 34.0 pg Final  . MCHC 01/06/2021 33.1  30.0 - 36.0 g/dL Final  . RDW 11/91/478201/28/2022 12.6  11.5 - 15.5 % Final  . Platelets 01/06/2021 218  150 - 400 K/uL Final  . nRBC 01/06/2021 0.0  0.0 - 0.2 % Final   Performed at [facility], [address] [place], [place] [address]  . ABO/RH(D) 01/06/2021 O POS   Final  . Antibody Screen 01/06/2021 NEG   Final  . Sample Expiration 01/06/2021 01/19/2021,2359   Final  . Extend sample reason 01/06/2021    Final                   Value:NO TRANSFUSIONS OR PREGNANCY IN THE PAST 3 MONTHS Performed at [facility], [address] [place], [place] [address]   . MRSA, PCR 01/06/2021 NEGATIVE  NEGATIVE Final  . Staphylococcus aureus 01/06/2021 NEGATIVE  NEGATIVE Final   Comment: (NOTE) The Xpert SA Assay (FDA approved for NASAL specimens in patients 69 years of age and older), is one component of a comprehensive surveillance program. It is not intended to diagnose infection nor to guide or monitor  treatment. Performed at [facility], [address] [place], [place] [address]   . Glucose-Capillary 01/06/2021 99  70 - 99 mg/dL Final   Glucose reference range applies only to samples taken after fasting for at least 8 hours.  Hospital Outpatient Visit on 12/19/2020  Component Date Value Ref Range Status  . WBC 12/19/2020 4.9  4.0 - 10.5 K/uL Final  . RBC 12/19/2020 3.54* 3.87 - 5.11 MIL/uL Final  . Hemoglobin 12/19/2020 11.4* 12.0 - 15.0 g/dL Final  . HCT 62/95/284101/09/2021 34.8* 36.0 - 46.0 % Final  . MCV 12/19/2020 98.3  80.0 - 100.0 fL Final  . MCH 12/19/2020 32.2  26.0 - 34.0 pg Final  . MCHC 12/19/2020 32.8  30.0 - 36.0 g/dL Final  . RDW 32/44/010201/09/2021 12.7  11.5 - 15.5 % Final  . Platelets 12/19/2020 215  150 - 400 K/uL Final  . nRBC 12/19/2020 0.0  0.0 - 0.2 % Final   Performed at [facility], [address] [place], [place] [address]  . Sodium 12/19/2020 139  135 - 145 mmol/L Final  . Potassium 12/19/2020 4.3  3.5 - 5.1 mmol/L Final  . Chloride 12/19/2020 99  98 - 111 mmol/L Final  . CO2 12/19/2020 31  22 - 32 mmol/L Final  . Glucose, Bld 12/19/2020 114* 70 - 99 mg/dL Final   Glucose reference range applies only to samples taken after fasting for at least 8 hours.  . BUN 12/19/2020 12  8 - 23 mg/dL Final  . Creatinine, Ser 12/19/2020 0.99  0.44 - 1.00 mg/dL Final  . Calcium 64/40/347401/09/2021 9.6  8.9 - 10.3 mg/dL Final  . Total Protein 12/19/2020 7.2  6.5 - 8.1 g/dL Final  . Albumin 25/95/638701/09/2021 4.0  3.5 - 5.0 g/dL Final  .  AST 12/19/2020 17  15 - 41 U/L Final  . ALT 12/19/2020 14  0 - 44 U/L Final  . Alkaline Phosphatase 12/19/2020 59  38 - 126 U/L Final  . Total Bilirubin 12/19/2020 0.4  0.3 - 1.2 mg/dL Final  . GFR, Estimated 12/19/2020 >60  >60 mL/min Final   Comment: (NOTE) Calculated using the CKD-EPI Creatinine Equation (2021)   . Anion gap 12/19/2020 9  5 - 15 Final   Performed at [facility], [address],  [place], [place] [address]  . Prothrombin Time 12/19/2020 12.6  11.4 - 15.2 seconds Final  . INR 12/19/2020 1.0  0.8 - 1.2 Final   Comment: (NOTE) INR goal varies based on device and disease states. Performed at [facility], [address], [place], [place] [address]   . aPTT 12/19/2020 33  24 - 36 seconds Final   Performed at [facility], [address] [place], [place] [address]  . ABO/RH(D) 12/19/2020 O POS   Final  . Antibody Screen 12/19/2020 NEG   Final  . Sample Expiration 12/19/2020 12/31/2020,2359   Final  . Extend sample reason 12/19/2020    Final                   Value:NO TRANSFUSIONS OR PREGNANCY IN THE PAST 3 MONTHS Performed at [facility], [address], [place], [place] [address]   . MRSA, PCR 12/19/2020 NEGATIVE  NEGATIVE Final  . Staphylococcus aureus 12/19/2020 NEGATIVE  NEGATIVE Final   Comment: (NOTE) The Xpert SA Assay (FDA approved for NASAL specimens in patients 74 years of age and older), is one component of a comprehensive surveillance program. It is not intended to diagnose infection nor to guide or monitor treatment. Performed at [facility], [address] [place], [place] [address]   . Hgb A1c MFr Bld 12/19/2020 5.5  4.8 - 5.6 % Final   Comment: (NOTE) Pre diabetes:          5.7%-6.4%  Diabetes:              >6.4%  Glycemic control for   <7.0% adults with diabetes   . Mean Plasma Glucose 12/19/2020 111.15  mg/dL Final   Performed at [facility], [address] [place], [place] [address]     X-Rays:DG Pelvis Portable  Result Date: 01/16/2021 CLINICAL DATA:  Right hip replacement EXAM: PORTABLE PELVIS 1-2 VIEWS COMPARISON:  Intraoperative examination, 2:23 p.m. FINDINGS: Bilateral total hip arthroplasty has been performed. Arthroplasty components overlie the expected position. Lateral aspect of the right greater trochanter is excluded from view. No unexpected  fracture or dislocation. IMPRESSION: Status post right total hip arthroplasty. No definite acute fracture or dislocation. Electronically Signed   By: Helyn Numbers MD   On: 01/16/2021 15:11   DG C-Arm 1-60 Min-No Report  Result Date: 01/16/2021 CLINICAL DATA:  Right hip replacement. EXAM: OPERATIVE right HIP (WITH PELVIS IF PERFORMED) 2 VIEWS TECHNIQUE: Fluoroscopic spot image(s) were submitted for interpretation post-operatively. Radiation exposure index: 0.9667 mGy. COMPARISON:  Dalisha 16, 2021. FINDINGS: Two intraoperative fluoroscopic images demonstrate the right femoral and acetabular components to be well situated. IMPRESSION: Status post right total hip arthroplasty. Electronically Signed   By: Lupita Raider M.D.   On: 01/16/2021 14:46   DG HIP OPERATIVE UNILAT W OR W/O PELVIS RIGHT  Result Date: 01/16/2021 CLINICAL DATA:  Right hip replacement. EXAM: OPERATIVE right HIP (WITH PELVIS IF  PERFORMED) 2 VIEWS TECHNIQUE: Fluoroscopic spot image(s) were submitted for interpretation post-operatively. Radiation exposure index: 0.9667 mGy. COMPARISON:  Korbyn 16, 2021. FINDINGS: Two intraoperative fluoroscopic images demonstrate the right femoral and acetabular components to be well situated. IMPRESSION: Status post right total hip arthroplasty. Electronically Signed   By: Lupita Raider M.D.   On: 01/16/2021 14:46    EKG: Orders placed or performed during the hospital encounter of 05/20/20  . EKG 12 lead  . EKG 12 lead     Hospital Course: Lila Eichenberger is a 70 y.o. who was admitted to [facility]. They were brought to the operating room on 01/16/2021 and underwent Procedure(s): TOTAL HIP ARTHROPLASTY ANTERIOR APPROACH.  Patient tolerated the procedure well and was later transferred to the recovery room and then to the orthopaedic floor for postoperative care. They were given PO and IV analgesics for pain control following their surgery. They were given 24 hours of postoperative antibiotics of   Anti-infectives (From admission, onward)   Start     Dose/Rate Route Frequency Ordered Stop   01/16/21 2000  ceFAZolin (ANCEF) IVPB 2g/100 mL premix        2 g 200 mL/hr over 30 Minutes Intravenous Every 6 hours 01/16/21 1715 01/17/21 0239   01/16/21 1000  ceFAZolin (ANCEF) IVPB 2g/100 mL premix        2 g 200 mL/hr over 30 Minutes Intravenous On call to O.R. 01/16/21 0981 01/16/21 1306     and started on DVT prophylaxis in the form of Xarelto.   PT and OT were ordered for total joint protocol. Discharge planning consulted to help with postop disposition and equipment needs. Patient had a decent night on the evening of surgery. Had issues with pain control, IV pain medication switched to dilaudid with improvement. They started to get up OOB with therapy on POD #0. Continued to work with therapy into POD #2. Pain control continued to be an issue, PO pain medication was switched to dilaudid with relief. Pt was seen during rounds on day two and was ready to go home pending progress with therapy. Dressing was changed and the incision was clean, dry, and intact. Pt worked with therapy for one additional session and was meeting their goals. She was discharged to home later that day in stable condition.  Diet: Diabetic diet Activity: WBAT Follow-up: in 2 weeks Disposition: Home with HEP Discharged Condition: stable   Discharge Instructions    Call MD / Call 911   Complete by: As directed    If you experience chest pain or shortness of breath, CALL 911 and be transported to the hospital emergency room.  If you develope a fever above 101 F, pus (white drainage) or increased drainage or redness at the wound, or calf pain, call your surgeon's office.   Change dressing   Complete by: As directed    You have an adhesive waterproof bandage over the incision. Leave this in place until your first follow-up appointment. Once you remove this you will not need to place another bandage.   Constipation  Prevention   Complete by: As directed    Drink plenty of fluids.  Prune juice may be helpful.  You may use a stool softener, such as Colace (over the counter) 100 mg twice a day.  Use MiraLax (over the counter) for constipation as needed.   Diet - low sodium heart healthy   Complete by: As directed    Do not sit on low chairs, stoools  or toilet seats, as it may be difficult to get up from low surfaces   Complete by: As directed    Driving restrictions   Complete by: As directed    No driving for two weeks   TED hose   Complete by: As directed    Use stockings (TED hose) for three weeks on both leg(s).  You may remove them at night for sleeping.   Weight bearing as tolerated   Complete by: As directed      Allergies as of 01/18/2021      Reactions   Penicillins Hives   Tolerated Cephalosporin Date: 05/26/20. Tolerated Cephalosporin Date: 01/17/21.      Medication List    STOP taking these medications   Biotin 5 MG Caps   Dialyvite Vitamin D 5000 125 MCG (5000 UT) capsule Generic drug: Cholecalciferol   multivitamin with minerals Tabs tablet     TAKE these medications   acetaminophen 650 MG CR tablet Commonly known as: TYLENOL Take 1,300 mg by mouth every 8 (eight) hours as needed for pain.   atorvastatin 10 MG tablet Commonly known as: LIPITOR Take 10 mg by mouth daily.   B-12 2500 MCG Tabs Take 2,500 mcg by mouth daily.   esomeprazole 40 MG capsule Commonly known as: NEXIUM Take 40 mg by mouth daily.   gabapentin 600 MG tablet Commonly known as: NEURONTIN Take 600 mg by mouth 4 (four) times daily.   HYDROmorphone 2 MG tablet Commonly known as: DILAUDID Take 1-2 tablets (2-4 mg total) by mouth every 4 (four) hours as needed for severe pain. Not to exceed 6 tablets a day.   lisinopril-hydrochlorothiazide 20-25 MG tablet Commonly known as: ZESTORETIC Take 1 tablet by mouth daily.   metFORMIN 500 MG 24 hr tablet Commonly known as: GLUCOPHAGE-XR Take 500 mg  by mouth daily.   methocarbamol 500 MG tablet Commonly known as: ROBAXIN Take 1 tablet (500 mg total) by mouth every 6 (six) hours as needed for muscle spasms.   oxymetazoline 0.05 % nasal spray Commonly known as: AFRIN Place 1 spray into both nostrils 2 (two) times daily.   rivaroxaban 10 MG Tabs tablet Commonly known as: XARELTO Take 1 tablet (10 mg total) by mouth daily with breakfast for 21 days.   tiZANidine 4 MG tablet Commonly known as: ZANAFLEX Take 4 mg by mouth 4 (four) times daily. Notes to patient: Muscle relaxer   traMADol HCl 100 MG Tabs Take 100 mg by mouth 4 (four) times daily. Notes to patient: Medication for moderate pain            Discharge Care Instructions  (From admission, onward)         Start     Ordered   01/17/21 0000  Weight bearing as tolerated        01/17/21 0745   01/17/21 0000  Change dressing       Comments: You have an adhesive waterproof bandage over the incision. Leave this in place until your first follow-up appointment. Once you remove this you will not need to place another bandage.   01/17/21 0745          Follow-up Information    Ollen Gross, MD. Schedule an appointment as soon as possible for a visit on 01/31/2021.   Specialty: Orthopedic Surgery Contact information: [address] [place] [address] [phone]               Signed: Arther Abbott, PA-C Orthopedic Surgery 01/23/2021,  6:55 AM

## 2021-06-07 ENCOUNTER — Other Ambulatory Visit: Payer: Self-pay

## 2021-06-07 ENCOUNTER — Encounter (HOSPITAL_BASED_OUTPATIENT_CLINIC_OR_DEPARTMENT_OTHER): Payer: Self-pay | Admitting: *Deleted

## 2021-06-07 DIAGNOSIS — Z79899 Other long term (current) drug therapy: Secondary | ICD-10-CM | POA: Diagnosis not present

## 2021-06-07 DIAGNOSIS — Z7984 Long term (current) use of oral hypoglycemic drugs: Secondary | ICD-10-CM | POA: Insufficient documentation

## 2021-06-07 DIAGNOSIS — E876 Hypokalemia: Secondary | ICD-10-CM | POA: Diagnosis not present

## 2021-06-07 DIAGNOSIS — Z87891 Personal history of nicotine dependence: Secondary | ICD-10-CM | POA: Diagnosis not present

## 2021-06-07 DIAGNOSIS — E119 Type 2 diabetes mellitus without complications: Secondary | ICD-10-CM | POA: Insufficient documentation

## 2021-06-07 DIAGNOSIS — Z20822 Contact with and (suspected) exposure to covid-19: Secondary | ICD-10-CM | POA: Diagnosis not present

## 2021-06-07 DIAGNOSIS — R5381 Other malaise: Secondary | ICD-10-CM | POA: Insufficient documentation

## 2021-06-07 DIAGNOSIS — I10 Essential (primary) hypertension: Secondary | ICD-10-CM | POA: Diagnosis not present

## 2021-06-07 DIAGNOSIS — R5383 Other fatigue: Secondary | ICD-10-CM | POA: Diagnosis not present

## 2021-06-07 DIAGNOSIS — Z96643 Presence of artificial hip joint, bilateral: Secondary | ICD-10-CM | POA: Diagnosis not present

## 2021-06-07 DIAGNOSIS — R630 Anorexia: Secondary | ICD-10-CM | POA: Diagnosis not present

## 2021-06-07 DIAGNOSIS — G4700 Insomnia, unspecified: Secondary | ICD-10-CM | POA: Insufficient documentation

## 2021-06-07 LAB — CBC WITH DIFFERENTIAL/PLATELET
Abs Immature Granulocytes: 0.01 10*3/uL (ref 0.00–0.07)
Basophils Absolute: 0 10*3/uL (ref 0.0–0.1)
Basophils Relative: 0 %
Eosinophils Absolute: 0 10*3/uL (ref 0.0–0.5)
Eosinophils Relative: 1 %
HCT: 35.2 % — ABNORMAL LOW (ref 36.0–46.0)
Hemoglobin: 12.1 g/dL (ref 12.0–15.0)
Immature Granulocytes: 0 %
Lymphocytes Relative: 26 %
Lymphs Abs: 1.5 10*3/uL (ref 0.7–4.0)
MCH: 30.9 pg (ref 26.0–34.0)
MCHC: 34.4 g/dL (ref 30.0–36.0)
MCV: 89.8 fL (ref 80.0–100.0)
Monocytes Absolute: 0.5 10*3/uL (ref 0.1–1.0)
Monocytes Relative: 8 %
Neutro Abs: 3.8 10*3/uL (ref 1.7–7.7)
Neutrophils Relative %: 65 %
Platelets: 206 10*3/uL (ref 150–400)
RBC: 3.92 MIL/uL (ref 3.87–5.11)
RDW: 13.1 % (ref 11.5–15.5)
WBC: 5.8 10*3/uL (ref 4.0–10.5)
nRBC: 0 % (ref 0.0–0.2)

## 2021-06-07 NOTE — ED Triage Notes (Addendum)
C/o nausea, insomnia , fatigue, decrease po intake  x 2 days " I just dont feel good"

## 2021-06-08 ENCOUNTER — Emergency Department (HOSPITAL_BASED_OUTPATIENT_CLINIC_OR_DEPARTMENT_OTHER)
Admission: EM | Admit: 2021-06-08 | Discharge: 2021-06-08 | Disposition: A | Discharge status: Home / Self Care | Payer: Medicare HMO | Attending: Emergency Medicine | Admitting: Emergency Medicine

## 2021-06-08 DIAGNOSIS — R5383 Other fatigue: Secondary | ICD-10-CM

## 2021-06-08 DIAGNOSIS — E876 Hypokalemia: Secondary | ICD-10-CM

## 2021-06-08 LAB — COMPREHENSIVE METABOLIC PANEL
ALT: 15 U/L (ref 0–44)
AST: 23 U/L (ref 15–41)
Albumin: 4.1 g/dL (ref 3.5–5.0)
Alkaline Phosphatase: 69 U/L (ref 38–126)
Anion gap: 9 (ref 5–15)
BUN: 9 mg/dL (ref 8–23)
CO2: 28 mmol/L (ref 22–32)
Calcium: 9.5 mg/dL (ref 8.9–10.3)
Chloride: 99 mmol/L (ref 98–111)
Creatinine, Ser: 1.05 mg/dL — ABNORMAL HIGH (ref 0.44–1.00)
GFR, Estimated: 58 mL/min — ABNORMAL LOW (ref >60)
Glucose, Bld: 114 mg/dL — ABNORMAL HIGH (ref 70–99)
Potassium: 3 mmol/L — ABNORMAL LOW (ref 3.5–5.1)
Sodium: 136 mmol/L (ref 135–145)
Total Bilirubin: 0.4 mg/dL (ref 0.3–1.2)
Total Protein: 6.8 g/dL (ref 6.5–8.1)

## 2021-06-08 LAB — URINALYSIS, MICROSCOPIC (REFLEX)

## 2021-06-08 LAB — URINALYSIS, ROUTINE W REFLEX MICROSCOPIC
Bilirubin Urine: NEGATIVE
Glucose, UA: NEGATIVE mg/dL
Ketones, ur: NEGATIVE mg/dL
Nitrite: NEGATIVE
Protein, ur: NEGATIVE mg/dL
Specific Gravity, Urine: <1.005 — ABNORMAL LOW (ref 1.005–1.030)
pH: 6.5 (ref 5.0–8.0)

## 2021-06-08 LAB — RESP PANEL BY RT-PCR (FLU A&B, COVID) ARPGX2
Influenza A by PCR: NEGATIVE
Influenza B by PCR: NEGATIVE
SARS Coronavirus 2 by RT PCR: NEGATIVE

## 2021-06-08 MED ORDER — ONDANSETRON 8 MG PO TBDP: 8.0000 mg | ORAL_TABLET | Freq: Three times a day (TID) | ORAL | 0 refills | Status: AC | PRN

## 2021-06-08 MED ORDER — ONDANSETRON 4 MG PO TBDP
8.0000 mg | ORAL_TABLET | Freq: Once | ORAL | Status: AC
  Administered 2021-06-08: 8 mg via ORAL
  Filled 2021-06-08: qty 2

## 2021-06-08 MED ORDER — POTASSIUM CHLORIDE CRYS ER 20 MEQ PO TBCR
40.0000 meq | EXTENDED_RELEASE_TABLET | Freq: Once | ORAL | Status: AC
  Administered 2021-06-08: 04:00:00 40 meq via ORAL
  Filled 2021-06-08: qty 2

## 2021-06-08 MED ORDER — POTASSIUM CHLORIDE CRYS ER 20 MEQ PO TBCR: 20.0000 meq | EXTENDED_RELEASE_TABLET | Freq: Every day | ORAL | 0 refills | Status: AC

## 2021-06-08 NOTE — ED Provider Notes (Signed)
[facility] Provider Note: Lowella Dell, MD, FACEP  CSN: [id number] MRN: [id number] ARRIVAL: 06/07/21 at 2333 ROOM: [facility]06/[facility]06   CHIEF COMPLAINT  Feels Bad   HISTORY OF PRESENT ILLNESS  06/08/21 3:21 AM Robyn Johnston is a 70 y.o. female who states she has not felt good for the past 2 days.  Specifically she has a general malaise, loss of appetite and difficulty sleeping.  She states the difficulty sleeping is primarily due to a sensation of burning in her back.  She does not describe this as an actual pain but rather a sensation that she is hot on the inside.  She has had no respiratory symptoms such as nasal congestion, sore throat, cough or shortness of breath.  She denies actual nausea but has had some gagging when she tries to eat.  She states she has been drinking fluids.  She has not had diarrhea but notices that her stools are greener than usual.  She has not had a fever or chills.  Nothing makes her symptoms better or worse.   Past Medical History:  Diagnosis Date   Arthritis    Complication of anesthesia    Diabetes mellitus without complication (HCC)    type 2    GERD (gastroesophageal reflux disease)    Hypertension    PONV (postoperative nausea and vomiting)     Past Surgical History:  Procedure Laterality Date   BACK SURGERY     spinal stenosis   BLADDER SURGERY     COLONOSCOPY     HYSTERECTOMY ABDOMINAL WITH SALPINGECTOMY     TOTAL HIP ARTHROPLASTY Left 05/25/2020   Procedure: TOTAL HIP ARTHROPLASTY ANTERIOR APPROACH;  Surgeon: Ollen Gross, MD;  Location: [facility];  Service: Orthopedics;  Laterality: Left;    TOTAL HIP ARTHROPLASTY Right 01/16/2021   Procedure: TOTAL HIP ARTHROPLASTY ANTERIOR APPROACH;  Surgeon: Ollen Gross, MD;  Location: [facility];  Service: Orthopedics;  Laterality: Right;     History reviewed. No pertinent family history.  Social History   Tobacco Use   Smoking status: Former    Pack years: 0.00    Types:  Cigarettes    Quit date: 2011    Years since quitting: 11.5   Smokeless tobacco: Never  Vaping Use   Vaping Use: Never used  Substance Use Topics   Alcohol use: Never   Drug use: Never    Prior to Admission medications   Medication Sig Start Date End Date Taking? Authorizing Provider  ondansetron (ZOFRAN ODT) 8 MG disintegrating tablet Take 1 tablet (8 mg total) by mouth every 8 (eight) hours as needed. 06/08/21  Yes Houa Nie, MD  potassium chloride SA (KLOR-CON) 20 MEQ tablet Take 1 tablet (20 mEq total) by mouth daily. 06/08/21  Yes Izac Faulkenberry, MD  acetaminophen (TYLENOL) 650 MG CR tablet Take 1,300 mg by mouth every 8 (eight) hours as needed for pain.    [provider]  atorvastatin (LIPITOR) 10 MG tablet Take 10 mg by mouth daily. 05/02/20   [provider]  Cyanocobalamin (B-12) 2500 MCG TABS Take 2,500 mcg by mouth daily.    [provider]  esomeprazole (NEXIUM) 40 MG capsule Take 40 mg by mouth daily. 04/18/20   [provider]  gabapentin (NEURONTIN) 600 MG tablet Take 600 mg by mouth 4 (four) times daily. 04/08/20   [provider]  HYDROmorphone (DILAUDID) 2 MG tablet Take 1-2 tablets (2-4 mg total) by mouth every 4 (four) hours as needed for  severe pain. Not to exceed 6 tablets a day. 01/17/21   Edmisten, Kristie L, PA  lisinopril-hydrochlorothiazide (ZESTORETIC) 20-25 MG tablet Take 1 tablet by mouth daily. 05/10/20   [provider]  metFORMIN (GLUCOPHAGE-XR) 500 MG 24 hr tablet Take 500 mg by mouth daily. 04/18/20   [provider]  methocarbamol (ROBAXIN) 500 MG tablet Take 1 tablet (500 mg total) by mouth every 6 (six) hours as needed for muscle spasms. 01/17/21   Edmisten, Lyn Hollingshead, PA  oxymetazoline (AFRIN) 0.05 % nasal spray Place 1 spray into both nostrils 2 (two) times daily.    [provider]  rivaroxaban (XARELTO) 10 MG TABS tablet Take 1 tablet (10 mg total) by mouth daily with breakfast for 21  days. 01/18/21 02/08/21  Edmisten, Lyn Hollingshead, PA  tiZANidine (ZANAFLEX) 4 MG tablet Take 4 mg by mouth 4 (four) times daily.    [provider]  traMADol HCl 100 MG TABS Take 100 mg by mouth 4 (four) times daily.    [provider]    Allergies Penicillins   REVIEW OF SYSTEMS  Negative except as noted here or in the History of Present Illness.   PHYSICAL EXAMINATION  Initial Vital Signs Blood pressure (!) 147/59, pulse (!) 52, temperature 98.4 F (36.9 C), temperature source Oral, resp. rate 18, height 5\' 4"  (1.626 m), weight 81.6 kg, SpO2 100 %.  Examination General: Well-developed, well-nourished female in no acute distress; appearance consistent with age of record HENT: normocephalic; atraumatic Eyes: pupils equal, round and reactive to light; extraocular muscles intact Neck: supple Heart: regular rate and rhythm; bradycardia Lungs: clear to auscultation bilaterally Abdomen: soft; nondistended; nontender; bowel sounds present Extremities: No deformity; full range of motion; pulses normal Neurologic: Awake, alert and oriented; motor function intact in all extremities and symmetric; no facial droop Skin: Warm and dry Psychiatric: Flat affect   RESULTS  Summary of this visit's results, reviewed and interpreted by myself:   EKG Interpretation  Date/Time:    Ventricular Rate:    PR Interval:    QRS Duration:   QT Interval:    QTC Calculation:   R Axis:     Text Interpretation:          Laboratory Studies: Results for orders placed or performed during the hospital encounter of 06/08/21 (from the past 24 hour(s))  CBC with Differential     Status: Abnormal   Collection Time: 06/07/21 11:45 PM  Result Value Ref Range   WBC 5.8 4.0 - 10.5 K/uL   RBC 3.92 3.87 - 5.11 MIL/uL   Hemoglobin 12.1 12.0 - 15.0 g/dL   HCT 06/09/21 (L) 74.1 - 63.8 %   MCV 89.8 80.0 - 100.0 fL   MCH 30.9 26.0 - 34.0 pg   MCHC 34.4 30.0 - 36.0 g/dL   RDW 45.3 64.6 - 80.3 %    Platelets 206 150 - 400 K/uL   nRBC 0.0 0.0 - 0.2 %   Neutrophils Relative % 65 %   Neutro Abs 3.8 1.7 - 7.7 K/uL   Lymphocytes Relative 26 %   Lymphs Abs 1.5 0.7 - 4.0 K/uL   Monocytes Relative 8 %   Monocytes Absolute 0.5 0.1 - 1.0 K/uL   Eosinophils Relative 1 %   Eosinophils Absolute 0.0 0.0 - 0.5 K/uL   Basophils Relative 0 %   Basophils Absolute 0.0 0.0 - 0.1 K/uL   Immature Granulocytes 0 %   Abs Immature Granulocytes 0.01 0.00 - 0.07 K/uL  Comprehensive  metabolic panel     Status: Abnormal   Collection Time: 06/07/21 11:45 PM  Result Value Ref Range   Sodium 136 135 - 145 mmol/L   Potassium 3.0 (L) 3.5 - 5.1 mmol/L   Chloride 99 98 - 111 mmol/L   CO2 28 22 - 32 mmol/L   Glucose, Bld 114 (H) 70 - 99 mg/dL   BUN 9 8 - 23 mg/dL   Creatinine, Ser 6.54 (H) 0.44 - 1.00 mg/dL   Calcium 9.5 8.9 - 65.0 mg/dL   Total Protein 6.8 6.5 - 8.1 g/dL   Albumin 4.1 3.5 - 5.0 g/dL   AST 23 15 - 41 U/L   ALT 15 0 - 44 U/L   Alkaline Phosphatase 69 38 - 126 U/L   Total Bilirubin 0.4 0.3 - 1.2 mg/dL   GFR, Estimated 58 (L) >60 mL/min   Anion gap 9 5 - 15  Urinalysis, Routine w reflex microscopic Urine, Clean Catch     Status: Abnormal   Collection Time: 06/08/21  3:37 AM  Result Value Ref Range   Color, Urine YELLOW YELLOW   APPearance CLEAR CLEAR   Specific Gravity, Urine <1.005 (L) 1.005 - 1.030   pH 6.5 5.0 - 8.0   Glucose, UA NEGATIVE NEGATIVE mg/dL   Hgb urine dipstick SMALL (A) NEGATIVE   Bilirubin Urine NEGATIVE NEGATIVE   Ketones, ur NEGATIVE NEGATIVE mg/dL   Protein, ur NEGATIVE NEGATIVE mg/dL   Nitrite NEGATIVE NEGATIVE   Leukocytes,Ua SMALL (A) NEGATIVE  Resp Panel by RT-PCR (Flu A&B, Covid) Urine, Clean Catch     Status: None   Collection Time: 06/08/21  3:37 AM   Specimen: Urine, Clean Catch; Nasopharyngeal(NP) swabs in vial transport medium  Result Value Ref Range   SARS Coronavirus 2 by RT PCR NEGATIVE NEGATIVE   Influenza A by PCR NEGATIVE NEGATIVE   Influenza B  by PCR NEGATIVE NEGATIVE  Urinalysis, Microscopic (reflex)     Status: Abnormal   Collection Time: 06/08/21  3:37 AM  Result Value Ref Range   RBC / HPF 0-5 0 - 5 RBC/hpf   WBC, UA 0-5 0 - 5 WBC/hpf   Bacteria, UA RARE (A) NONE SEEN   Squamous Epithelial / LPF 0-5 0 - 5   Imaging Studies: No results found.  ED COURSE and MDM  Nursing notes, initial and subsequent vitals signs, including pulse oximetry, reviewed and interpreted by myself.  Vitals:   06/08/21 0230 06/08/21 0342 06/08/21 0430 06/08/21 0530  BP: (!) 147/59 (!) 141/55 (!) 129/58 (!) 140/52  Pulse: (!) 52 (!) 57 (!) 54 (!) 46  Resp: 18 16 16 16   Temp:      TempSrc:      SpO2: 100% 99% 96% 97%  Weight:      Height:       Medications  potassium chloride SA (KLOR-CON) CR tablet 40 mEq (40 mEq Oral Given 06/08/21 0341)  ondansetron (ZOFRAN-ODT) disintegrating tablet 8 mg (8 mg Oral Given 06/08/21 0509)   6:11 AM Patient developed nausea after taking a nap and was given Zofran with improvement.  The cause of her malaise is unclear.  She has hypokalemia but I would not expect this to be causing her symptoms.  She was advised to contact her PCP today for further evaluation.   PROCEDURES  Procedures   ED DIAGNOSES     ICD-10-CM   1. Malaise and fatigue  R53.81    R53.83     2. Hypokalemia  E87.6  Deeanne Deininger, Jonny RuizJohn, MD 06/08/21 [phone]

## 2021-07-27 IMAGING — RF DG HIP (WITH PELVIS) OPERATIVE*L*
1 series · 2 of 2 positions shown · non-contrast
Comparison: None.

CLINICAL DATA: Status post left hip replacement.

EXAM:
OPERATIVE left HIP (WITH PELVIS IF PERFORMED) 2 VIEWS
TECHNIQUE: Fluoroscopic spot image(s) were submitted for interpretation
post-operatively.
Radiation exposure index: 1.0311 mGy.

[Series 1: unknown protocol · 0.20mm/px · 2 of 2 slices shown]
[im 1/2]
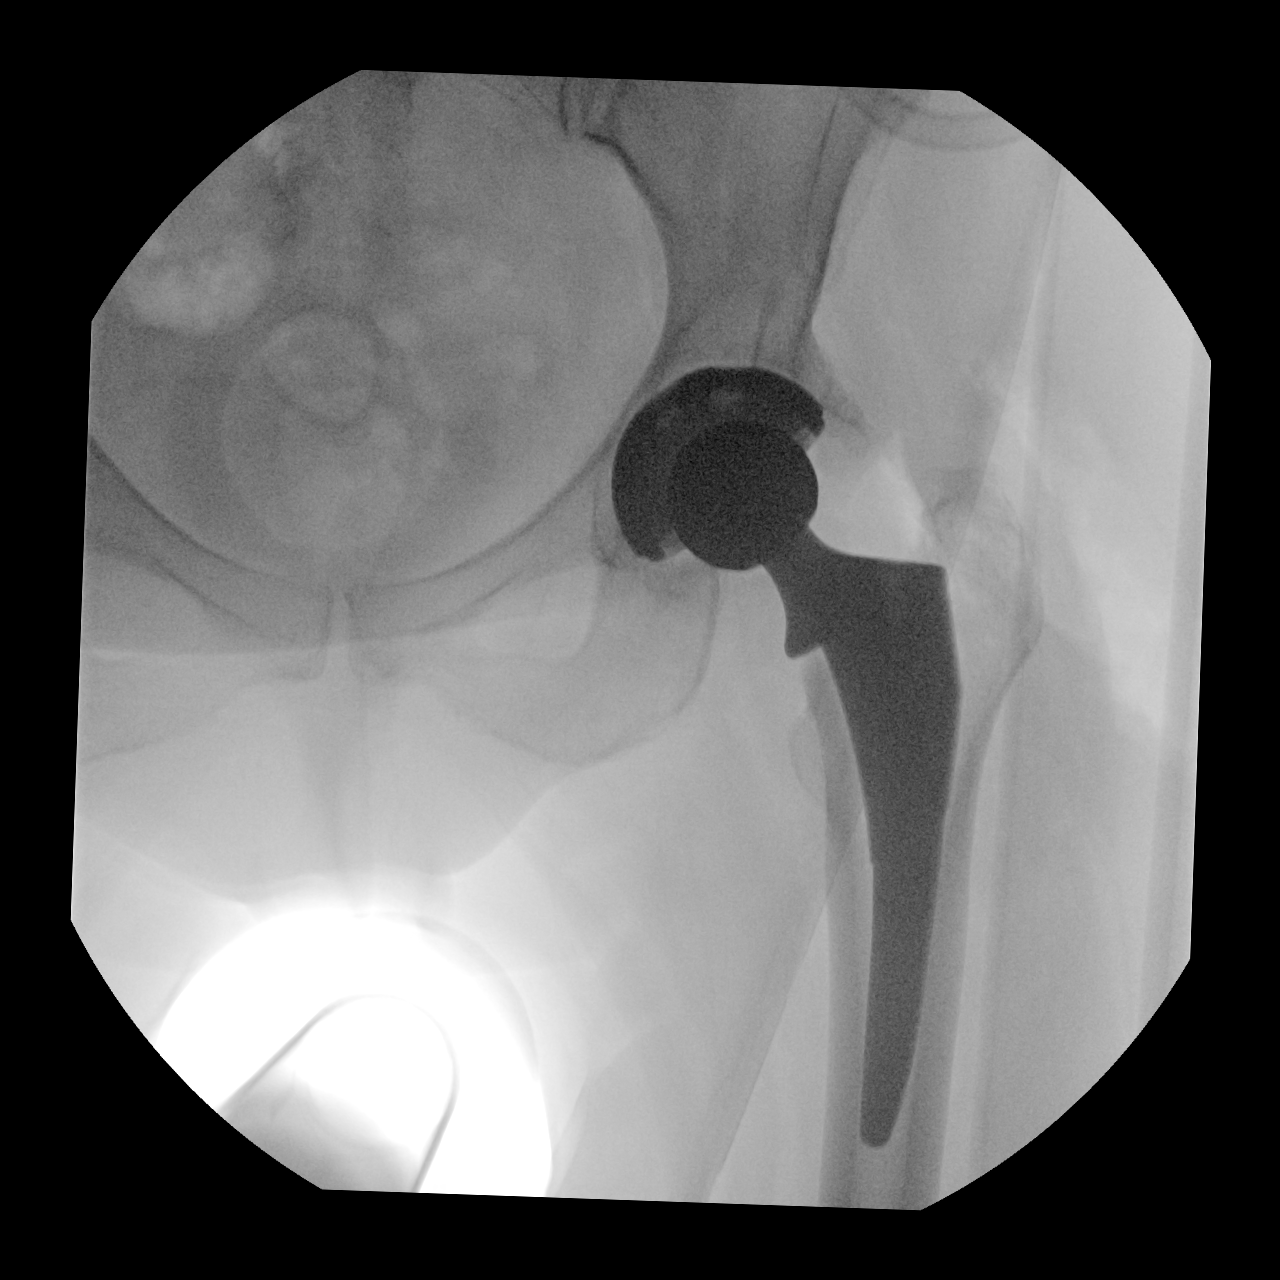
[im 2/2]
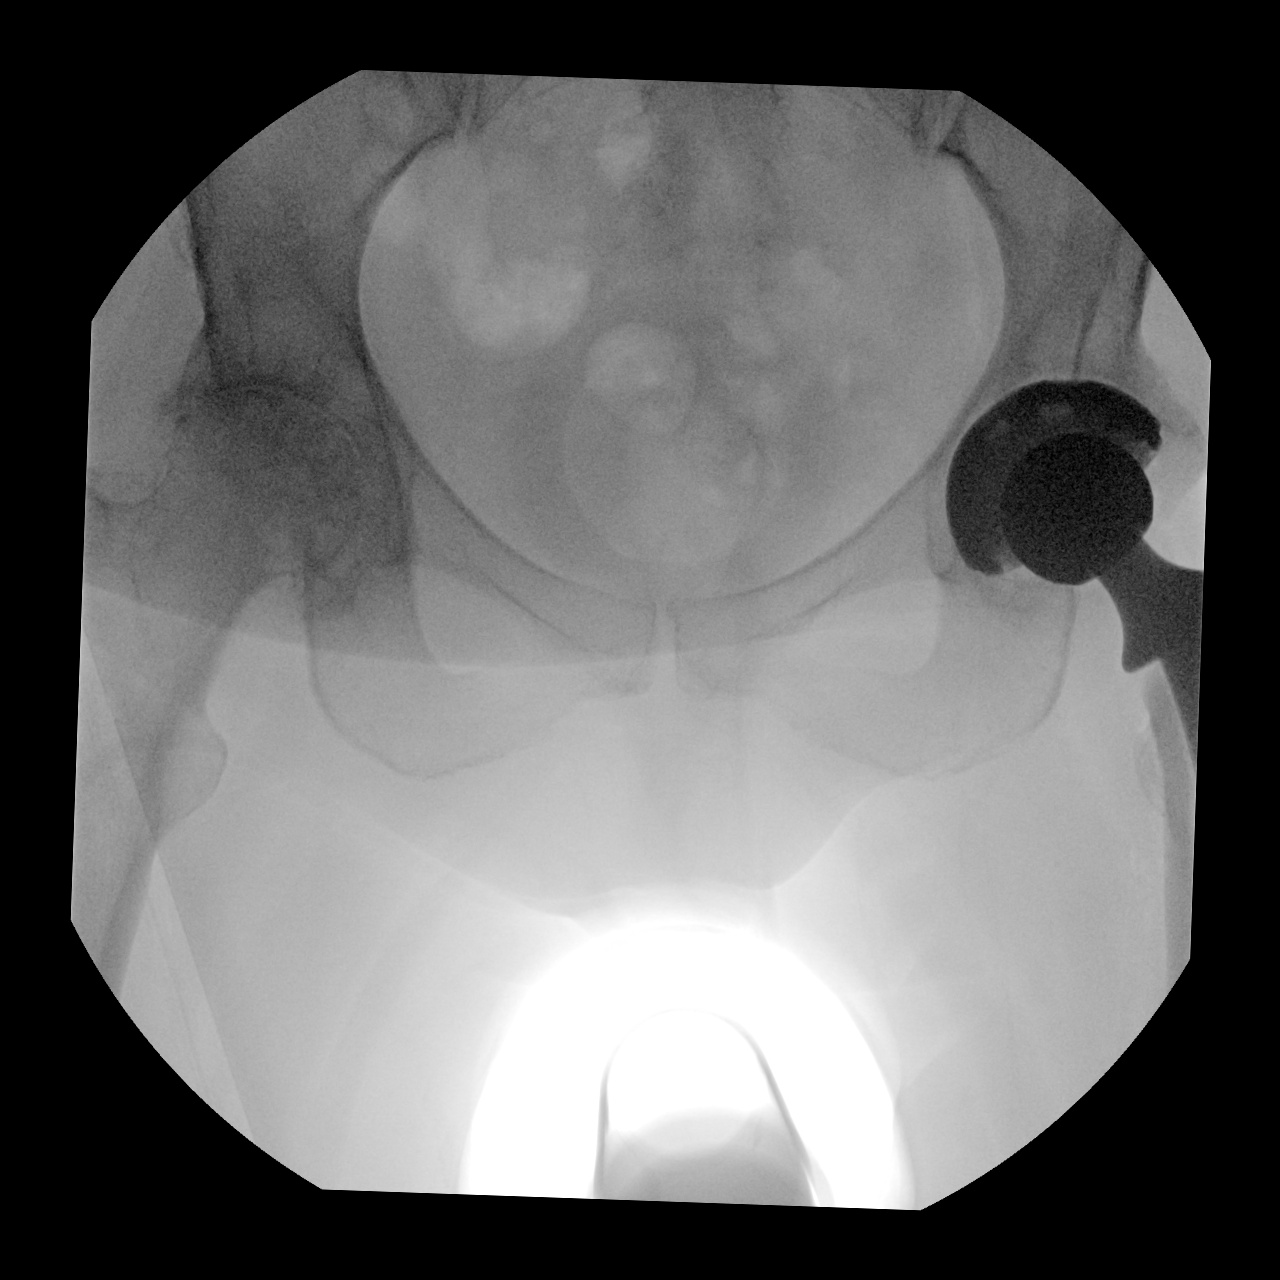

[2 of 2 positions shown; findings below may reference images not displayed]

FINDINGS: Two intraoperative fluoroscopic images of the left hip demonstrate
the acetabular and femoral components to be well situated.
IMPRESSION: Status post left total hip arthroplasty.

## 2021-07-27 IMAGING — DX DG PORTABLE PELVIS
1 series · 1 of 1 positions shown · non-contrast
Comparison: Fluoroscopic images of same day.

CLINICAL DATA: Status post left hip replacement.

EXAM:
PORTABLE PELVIS 1-2 VIEWS

[pelvis ap]
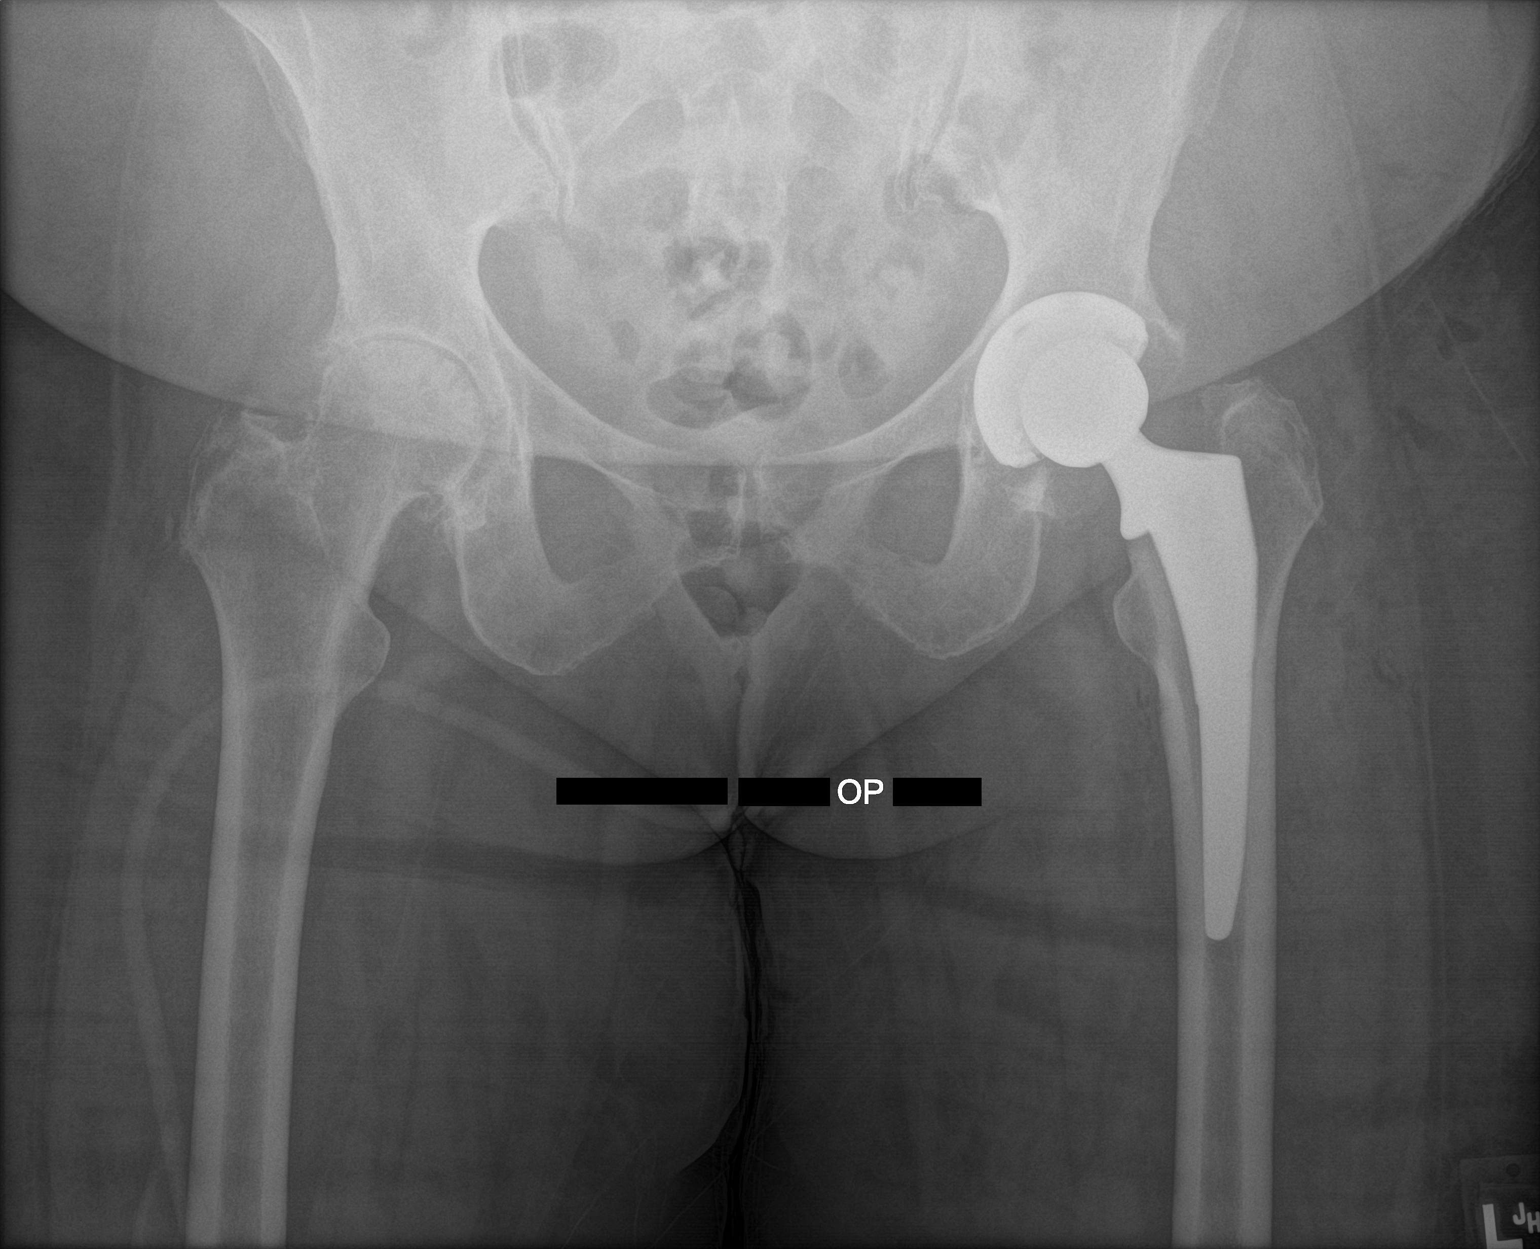

[1 of 1 positions shown; findings below may reference images not displayed]

FINDINGS: The left femoral and acetabular components appear to be well
situated. Expected postoperative changes are seen involving the
surrounding soft tissues.
IMPRESSION: Status post left total hip arthroplasty.

## 2022-03-20 IMAGING — DX DG PORTABLE PELVIS
1 series · 1 of 1 positions shown · non-contrast
Comparison: Intraoperative examination, [DATE] p.m.

CLINICAL DATA: Right hip replacement

EXAM:
PORTABLE PELVIS 1-2 VIEWS

[pelvis ap]
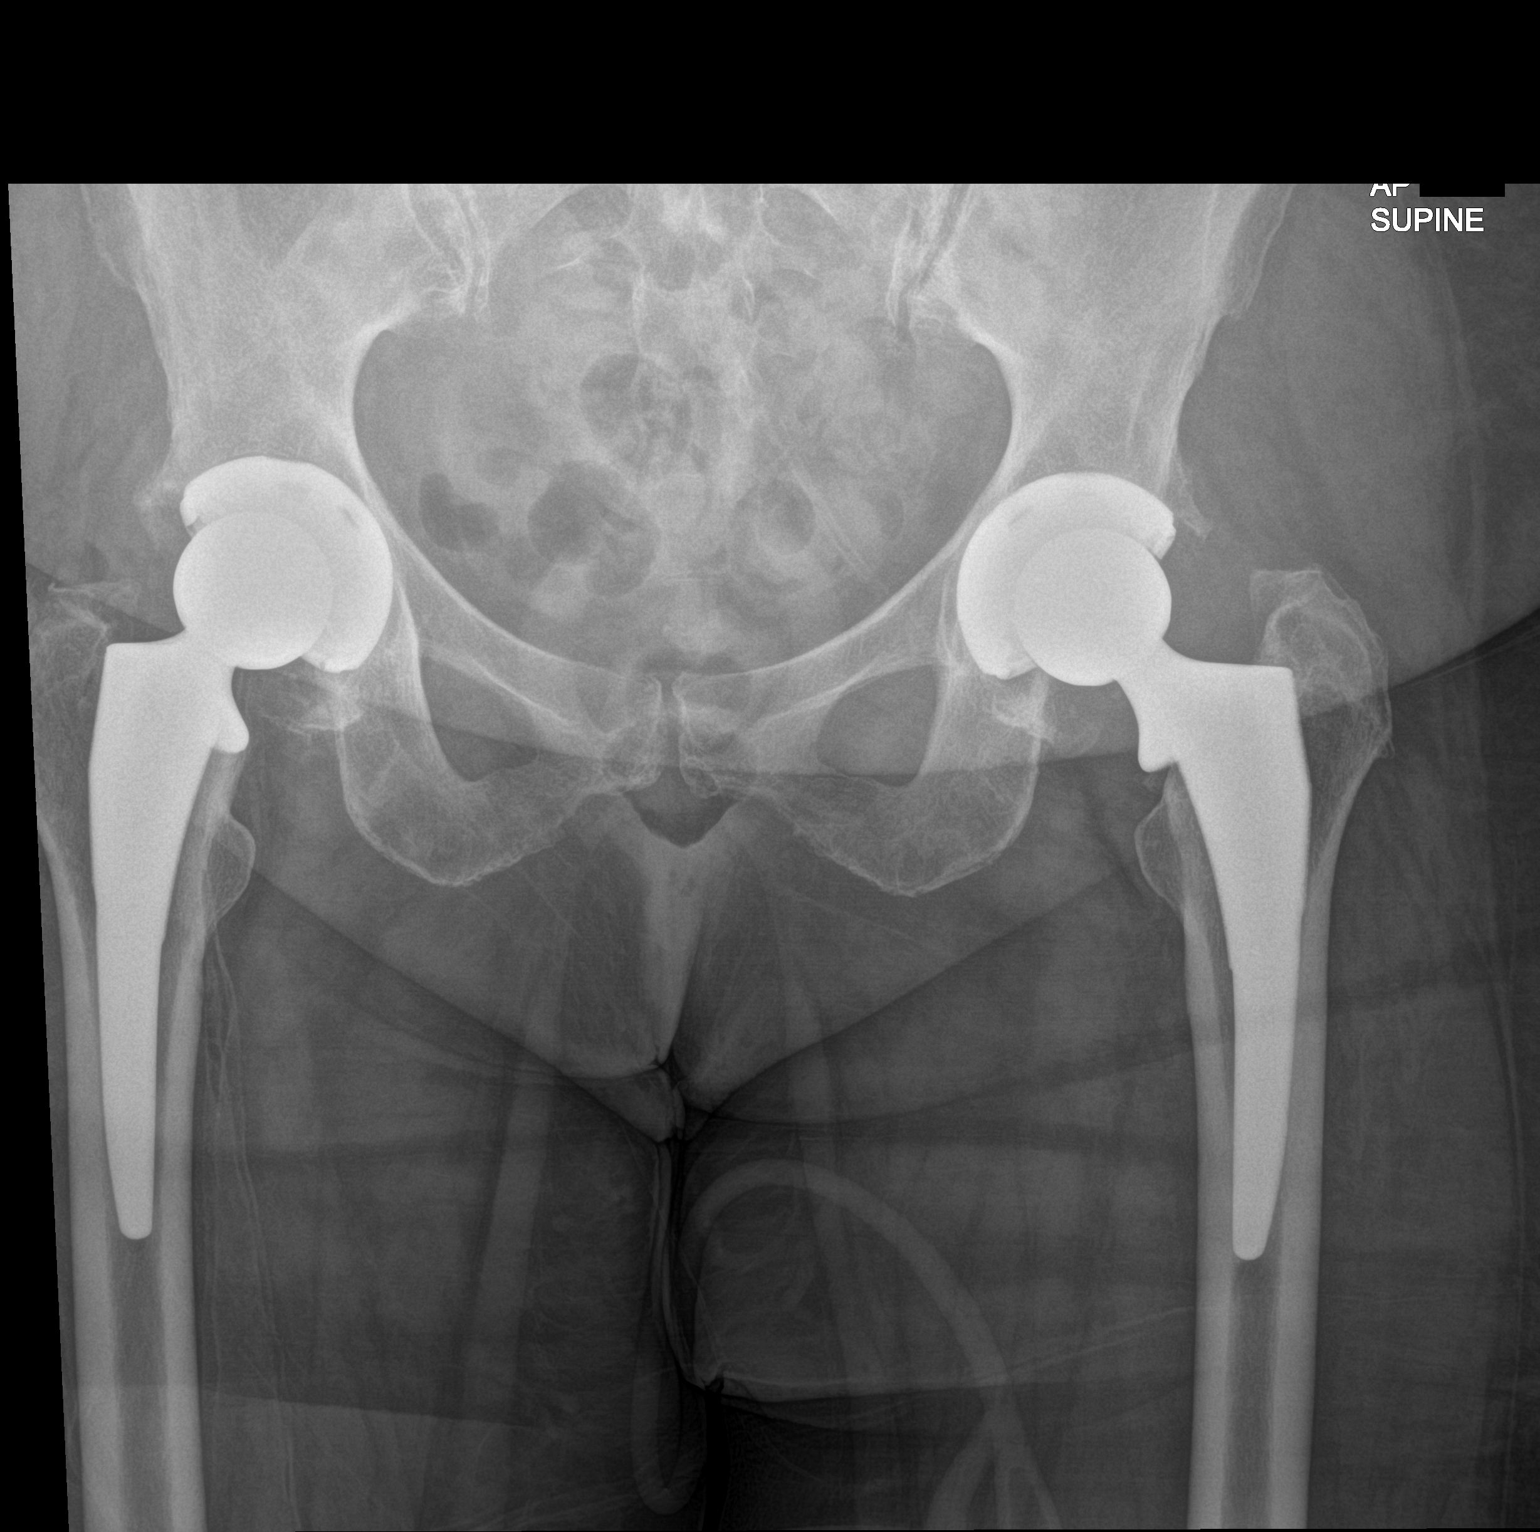

[1 of 1 positions shown; findings below may reference images not displayed]

FINDINGS: Bilateral total hip arthroplasty has been performed. Arthroplasty
components overlie the expected position. Lateral aspect of the
right greater trochanter is excluded from view. No unexpected
fracture or dislocation.
IMPRESSION: Status post right total hip arthroplasty. No definite acute fracture
or dislocation.
